# Patient Record
Sex: Male | Born: 1975
Health system: Southern US, Community
[De-identification: ages and names within clinical notes are randomized; demographics above are authoritative.]

## PROBLEM LIST (undated history)

## (undated) DIAGNOSIS — M79603 Pain in arm, unspecified: Secondary | ICD-10-CM

## (undated) DIAGNOSIS — M549 Dorsalgia, unspecified: Secondary | ICD-10-CM

## (undated) DIAGNOSIS — M542 Cervicalgia: Secondary | ICD-10-CM

## (undated) HISTORY — PX: KNEE SURGERY: SHX244

## (undated) HISTORY — PX: HERNIA REPAIR: SHX51

---

## 2006-03-10 ENCOUNTER — Emergency Department: Payer: Self-pay | Admitting: General Practice

## 2006-07-12 ENCOUNTER — Emergency Department: Payer: Self-pay | Admitting: Emergency Medicine

## 2006-12-04 ENCOUNTER — Emergency Department: Payer: Self-pay | Admitting: Emergency Medicine

## 2008-08-14 ENCOUNTER — Emergency Department: Payer: Self-pay

## 2011-02-11 ENCOUNTER — Other Ambulatory Visit: Payer: Self-pay | Admitting: Physician Assistant

## 2011-02-11 DIAGNOSIS — M5410 Radiculopathy, site unspecified: Secondary | ICD-10-CM

## 2011-02-18 ENCOUNTER — Other Ambulatory Visit: Payer: Self-pay | Admitting: Physician Assistant

## 2011-02-18 ENCOUNTER — Ambulatory Visit
Admission: RE | Admit: 2011-02-18 | Discharge: 2011-02-18 | Disposition: A | Discharge status: Home / Self Care | Payer: 59 | Source: Ambulatory Visit | Attending: Physician Assistant | Admitting: Physician Assistant

## 2011-02-18 DIAGNOSIS — M5410 Radiculopathy, site unspecified: Secondary | ICD-10-CM

## 2011-02-18 DIAGNOSIS — M5412 Radiculopathy, cervical region: Secondary | ICD-10-CM

## 2011-02-18 DIAGNOSIS — M4802 Spinal stenosis, cervical region: Secondary | ICD-10-CM

## 2011-03-11 ENCOUNTER — Other Ambulatory Visit: Payer: 59

## 2012-06-11 ENCOUNTER — Emergency Department (HOSPITAL_COMMUNITY): Payer: Self-pay

## 2012-06-11 ENCOUNTER — Encounter (HOSPITAL_COMMUNITY): Payer: Self-pay | Admitting: *Deleted

## 2012-06-11 ENCOUNTER — Emergency Department (HOSPITAL_COMMUNITY)
Admission: EM | Admit: 2012-06-11 | Discharge: 2012-06-11 | Disposition: A | Discharge status: Home / Self Care | Payer: Self-pay | Attending: Emergency Medicine | Admitting: Emergency Medicine

## 2012-06-11 DIAGNOSIS — W1789XA Other fall from one level to another, initial encounter: Secondary | ICD-10-CM | POA: Insufficient documentation

## 2012-06-11 DIAGNOSIS — Y998 Other external cause status: Secondary | ICD-10-CM | POA: Insufficient documentation

## 2012-06-11 DIAGNOSIS — Y9383 Activity, rough housing and horseplay: Secondary | ICD-10-CM | POA: Insufficient documentation

## 2012-06-11 DIAGNOSIS — S4350XA Sprain of unspecified acromioclavicular joint, initial encounter: Secondary | ICD-10-CM | POA: Insufficient documentation

## 2012-06-11 DIAGNOSIS — F17200 Nicotine dependence, unspecified, uncomplicated: Secondary | ICD-10-CM | POA: Insufficient documentation

## 2012-06-11 MED ORDER — NAPROXEN 500 MG PO TABS: 500.0000 mg | ORAL_TABLET | Freq: Two times a day (BID) | ORAL | Status: DC

## 2012-06-11 MED ORDER — OXYCODONE-ACETAMINOPHEN 5-325 MG PO TABS
1.0000 | ORAL_TABLET | Freq: Once | ORAL | Status: AC
  Administered 2012-06-11: 1 via ORAL
  Filled 2012-06-11: qty 1

## 2012-06-11 MED ORDER — OXYCODONE-ACETAMINOPHEN 5-325 MG PO TABS: 1.0000 | ORAL_TABLET | ORAL | Status: AC | PRN

## 2012-06-11 NOTE — Discharge Instructions (Signed)
Acromioclavicular Injuries  The AC (acromioclavicular) joint is the joint in the shoulder where the collarbone (clavicle) meets the shoulder blade (scapula). The part of the shoulder blade connected to the collarbone is called the acromion. Common problems with and treatments for the AC joint are detailed below.  ARTHRITIS  Arthritis occurs when the joint has been injured and the smooth padding between the joints (cartilage) is lost. This is the wear and tear seen in most joints of the body if they have been overused. This causes the joint to produce pain and swelling which is worse with activity.   AC JOINT SEPARATION  AC joint separation means that the ligaments connecting the acromion of the shoulder blade and collarbone have been damaged, and the two bones no longer line up. AC separations can be anywhere from mild to severe, and are "graded" depending upon which ligaments are torn and how badly they are torn.   Grade I Injury: the least damage is done, and the AC joint still lines up.   Grade II Injury: damage to the ligaments which reinforce the AC joint. In a Grade II injury, these ligaments are stretched but not entirely torn. When stressed, the AC joint becomes painful and unstable.   Grade III Injury: AC and secondary ligaments are completely torn, and the collarbone is no longer attached to the shoulder blade. This results in deformity; a prominence of the end of the clavicle.  AC JOINT FRACTURE  AC joint fracture means that there has been a break in the bones of the AC joint, usually the end of the clavicle.  TREATMENT  TREATMENT OF AC ARTHRITIS   There is currently no way to replace the cartilage damaged by arthritis. The best way to improve the condition is to decrease the activities which aggravate the problem. Application of ice to the joint helps decrease pain and soreness (inflammation). The use of non-steroidal anti-inflammatory medication is helpful.   If less conservative measures do not  work, then cortisone shots (injections) may be used. These are anti-inflammatories; they decrease the soreness in the joint and swelling.   If non-surgical measures fail, surgery may be recommended. The procedure is generally removal of a portion of the end of the clavicle. This is the part of the collarbone closest to your acromion which is stabilized with ligaments to the acromion of the shoulder blade. This surgery may be performed using a tube-like instrument with a light (arthroscope) for looking into a joint. It may also be performed as an open surgery through a small incision by the surgeon. Most patients will have good range of motion within 6 weeks and may return to all activity including sports by 8-12 weeks, barring complications.  TREATMENT OF AN AC SEPARATION   The initial treatment is to decrease pain. This is best accomplished by immobilizing the arm in a sling and placing an ice pack to the shoulder for 20 to 30 minutes every 2 hours as needed. As the pain starts to subside, it is important to begin moving the fingers, wrist, elbow and eventually the shoulder in order to prevent a stiff or "frozen" shoulder. Instruction on when and how much to move the shoulder will be provided by your caregiver. The length of time needed to regain full motion and function depends on the amount or grade of the injury. Recovery from a Grade I AC separation usually takes 10 to 14 days, whereas a Grade III may take 6 to 8 weeks.   Grade   III injuries usually allow return to full activity with few restrictions. Treatment is also based on the activity demands of the injured shoulder. For example, a high level quarterback with an injured throwing arm will receive more aggressive treatment than someone with a desk job who rarely uses his/her arm for strenuous activities. In some cases, a painful lump may persist which could require a later surgery.  Surgery can be very successful, but the benefits must be weighed against the potential risks.  TREATMENT OF AN AC JOINT FRACTURE Fracture treatment depends on the type of fracture. Sometimes a splint or sling may be all that is required. Other times surgery may be required for repair. This is more frequently the case when the ligaments supporting the clavicle are completely torn. Your caregiver will help you with these decisions and together you can decide what will be the best treatment. HOME CARE INSTRUCTIONS   Apply ice to the injury for 15 to 20 minutes each hour while awake for 2 days. Put the ice in a plastic bag and place a towel between the bag of ice and skin.   If a sling has been applied, wear it constantly for as long as directed by your caregiver, even at night. The sling or splint can be removed for bathing or showering or as directed. Be sure to keep the shoulder in the same place as when the sling is on. Do not lift the arm.   If a figure-of-eight splint has been applied it should be tightened gently by another person every day. Tighten it enough to keep the shoulders held back. Allow enough room to place the index finger between the body and strap. Loosen the splint immediately if there is numbness or tingling in the hands.   Take over-the-counter or prescription medicines for pain, discomfort or fever as directed by your caregiver.   If you or your child has received a follow up appointment, it is very important to keep that appointment in order to avoid long term complications, chronic pain or disability.  SEEK MEDICAL CARE IF:   The pain is not relieved with medications.   There is increased swelling or discoloration that continues to get worse rather than better.   You or your child has been unable to follow up as instructed.   There is progressive numbness and tingling in the arm, forearm or hand.  SEEK IMMEDIATE MEDICAL CARE IF:   The arm is numb, cold or pale.    There is increasing pain in the hand, forearm or fingers.  MAKE SURE YOU:   Understand these instructions.   Will watch your condition.   Will get help right away if you are not doing well or get worse.  Document Released: 09/21/2005 Document Revised: 12/01/2011 Document Reviewed: 03/16/2009 [facility] Patient Information 2012 [place], [place].  Smoking Hazards Smoking cigarettes is extremely bad for your health. Tobacco smoke has over 200 known poisons in it. There are over 60 chemicals in tobacco smoke that cause cancer. Some of the chemicals found in cigarette smoke include:   Cyanide.   Benzene.   Formaldehyde.   Methanol (wood alcohol).   Acetylene (fuel used in welding torches).   Ammonia.  Cigarette smoke also contains the poisonous gases nitrogen oxide and carbon monoxide.  Cigarette smokers have an increased risk of many serious medical problems, including:  Lung cancer.   Lung disease (such as pneumonia, bronchitis, and emphysema).   Heart attack and chest pain due to the heart not  getting enough oxygen (angina).   Heart disease and peripheral blood vessel disease.   Hypertension.   Stroke.   Oral cancer (cancer of the lip, mouth, or voice box).   Bladder cancer.   Pancreatic cancer.   Cervical cancer.   Pregnancy complications, including premature birth.   Low birthweight babies.   Early menopause.   Lower estrogen level for women.   Infertility.   Facial wrinkles.   Blindness.   Increased risk of broken bones (fractures).   Senile dementia.   Stillbirths and smaller newborn babies, birth defects, and genetic damage to sperm.   Stomach ulcers and internal bleeding.  Children of smokers have an increased risk of the following, because of secondhand smoke exposure:   Sudden infant death syndrome (SIDS).   Respiratory infections.   Lung cancer.   Heart disease.   Ear infections.  Smoking causes approximately:  90% of all lung  cancer deaths in men.   80% of all lung cancer deaths in women.   90% of deaths from chronic obstructive lung disease.  Compared with nonsmokers, smoking increases the risk of:  Coronary heart disease by 2 to 4 times.   Stroke by 2 to 4 times.   Men developing lung cancer by 23 times.   Women developing lung cancer by 13 times.   Dying from chronic obstructive lung diseases by 12 times.  Someone who smokes 2 packs a day loses about 8 years of his or her life. Even smoking lightly shortens your life expectancy by several years. You can greatly reduce the risk of medical problems for you and your family by stopping now. Smoking is the most preventable cause of death and disease in our society. Within days of quitting smoking, your circulation returns to normal, you decrease the risk of having a heart attack, and your lung capacity improves. There may be some increased phlegm in the first few days after quitting, and it may take months for your lungs to clear up completely. Quitting for 10 years cuts your lung cancer risk to almost that of a nonsmoker. WHY IS SMOKING ADDICTIVE?  Nicotine is the chemical agent in tobacco that is capable of causing addiction or dependence.   When you smoke and inhale, nicotine is absorbed rapidly into the bloodstream through your lungs. Nicotine absorbed through the lungs is capable of creating a powerful addiction. Both inhaled and non-inhaled nicotine may be addictive.   Addiction studies of cigarettes and spit tobacco show that addiction to nicotine occurs mainly during the teen years, when young people begin using tobacco products.  WHAT ARE THE BENEFITS OF QUITTING?  There are many health benefits to quitting smoking.   Likelihood of developing cancer and heart disease decreases. Health improvements are seen almost immediately.   Blood pressure, pulse rate, and breathing patterns start returning to normal soon after quitting.   People who quit may see  an improvement in their overall quality of life.  Some people choose to quit all at once. Other options include nicotine replacement products, such as patches, gum, and nasal sprays. Do not use these products without first checking with your caregiver. QUITTING SMOKING It is not easy to quit smoking. Nicotine is addicting, and longtime habits are hard to change. To start, you can write down all your reasons for quitting, tell your family and friends you want to quit, and ask for their help. Throw your cigarettes away, chew gum or cinnamon sticks, keep your hands busy, and drink extra water  or juice. Go for walks and practice deep breathing to relax. Think of all the money you are saving: around $1,000 a year, for the average pack-a-day smoker. Nicotine patches and gum have been shown to improve success at efforts to stop smoking. Zyban (bupropion) is an anti-depressant drug that can be prescribed to reduce nicotine withdrawal symptoms and to suppress the urge to smoke. Smoking is an addiction with both physical and psychological effects. Joining a stop-smoking support group can help you cope with the emotional issues. For more information and advice on programs to stop smoking, call your doctor, your local hospital, or these organizations:  American Lung Association - 1-800-LUNGUSA   American Cancer Society - 1-800-ACS-2345  Document Released: 01/19/2005 Document Revised: 12/01/2011 Document Reviewed: 09/23/2009 [facility] Patient Information 2012 [place], [place].  Naproxen and naproxen sodium oral immediate-release tablets What is this medicine? NAPROXEN (na PROX en) is a non-steroidal anti-inflammatory drug (NSAID). It is used to reduce swelling and to treat pain. This medicine may be used for dental pain, headache, or painful monthly periods. It is also used for painful joint and muscular problems such as arthritis, tendinitis, bursitis, and gout. This medicine may be used for other purposes; ask  your health care provider or pharmacist if you have questions. What should I tell my health care provider before I take this medicine? They need to know if you have any of these conditions: -asthma -cigarette smoker -drink more than 3 alcohol containing drinks a day -heart disease or circulation problems such as heart failure or leg edema (fluid retention) -high blood pressure -kidney disease -liver disease -stomach bleeding or ulcers -an unusual or allergic reaction to naproxen, aspirin, other NSAIDs, other medicines, foods, dyes, or preservatives -pregnant or trying to get pregnant -breast-feeding How should I use this medicine? Take this medicine by mouth with a glass of water. Follow the directions on the prescription label. Take it with food if your stomach gets upset. Try to not lie down for at least 10 minutes after you take it. Take your medicine at regular intervals. Do not take your medicine more often than directed. Long-term, continuous use may increase the risk of heart attack or stroke. A special MedGuide will be given to you by the pharmacist with each prescription and refill. Be sure to read this information carefully each time. Talk to your pediatrician regarding the use of this medicine in children. Special care may be needed. Overdosage: If you think you have taken too much of this medicine contact a poison control center or emergency room at once. NOTE: This medicine is only for you. Do not share this medicine with others. What if I miss a dose? If you miss a dose, take it as soon as you can. If it is almost time for your next dose, take only that dose. Do not take double or extra doses. What may interact with this medicine? -alcohol -aspirin -cidofovir -diuretics -lithium -methotrexate -other drugs for inflammation like ketorolac or prednisone -pemetrexed -probenecid -warfarin This list may not describe all possible interactions. Give your health care provider a  list of all the medicines, herbs, non-prescription drugs, or dietary supplements you use. Also tell them if you smoke, drink alcohol, or use illegal drugs. Some items may interact with your medicine. What should I watch for while using this medicine? Tell your doctor or health care professional if your pain does not get better. Talk to your doctor before taking another medicine for pain. Do not treat yourself. This medicine  does not prevent heart attack or stroke. In fact, this medicine may increase the chance of a heart attack or stroke. The chance may increase with longer use of this medicine and in people who have heart disease. If you take aspirin to prevent heart attack or stroke, talk with your doctor or health care professional. Do not take other medicines that contain aspirin, ibuprofen, or naproxen with this medicine. Side effects such as stomach upset, nausea, or ulcers may be more likely to occur. Many medicines available without a prescription should not be taken with this medicine. This medicine can cause ulcers and bleeding in the stomach and intestines at any time during treatment. Do not smoke cigarettes or drink alcohol. These increase irritation to your stomach and can make it more susceptible to damage from this medicine. Ulcers and bleeding can happen without warning symptoms and can cause death. You may get drowsy or dizzy. Do not drive, use machinery, or do anything that needs mental alertness until you know how this medicine affects you. Do not stand or sit up quickly, especially if you are an older patient. This reduces the risk of dizzy or fainting spells. This medicine can cause you to bleed more easily. Try to avoid damage to your teeth and gums when you brush or floss your teeth. What side effects may I notice from receiving this medicine? Side effects that you should report to your doctor or health care professional as soon as possible: -black or bloody stools, blood in the  urine or vomit -blurred vision -chest pain -difficulty breathing or wheezing -nausea or vomiting -severe stomach pain -skin rash, skin redness, blistering or peeling skin, hives, or itching -slurred speech or weakness on one side of the body -swelling of eyelids, throat, lips -unexplained weight gain or swelling -unusually weak or tired -yellowing of eyes or skin Side effects that usually do not require medical attention (report to your doctor or health care professional if they continue or are bothersome): -constipation -headache -heartburn This list may not describe all possible side effects. Call your doctor for medical advice about side effects. You may report side effects to FDA at 1-800-FDA-1088. Where should I keep my medicine? Keep out of the reach of children. Store at room temperature between 15 and 30 degrees C (59 and 86 degrees F). Keep container tightly closed. Throw away any unused medicine after the expiration date. NOTE: This sheet is a summary. It may not cover all possible information. If you have questions about this medicine, talk to your doctor, pharmacist, or health care provider.  2012, [employer]vier/[employer] Standard. (12/14/2009 8:10:16 PM)  Acetaminophen; Oxycodone tablets What is this medicine? ACETAMINOPHEN; OXYCODONE (a set a MEE noe fen; ox i KOE done) is a pain reliever. It is used to treat mild to moderate pain. This medicine may be used for other purposes; ask your health care provider or pharmacist if you have questions. What should I tell my health care provider before I take this medicine? They need to know if you have any of these conditions: -brain tumor -Crohn's disease, inflammatory bowel disease, or ulcerative colitis -drink more than 3 alcohol containing drinks per day -drug abuse or addiction -head injury -heart or circulation problems -kidney disease or problems going to the bathroom -liver disease -lung disease, asthma, or breathing  problems -an unusual or allergic reaction to acetaminophen, oxycodone, other opioid analgesics, other medicines, foods, dyes, or preservatives -pregnant or trying to get pregnant -breast-feeding How should I use this medicine? Take  this medicine by mouth with a full glass of water. Follow the directions on the prescription label. Take your medicine at regular intervals. Do not take your medicine more often than directed. Talk to your pediatrician regarding the use of this medicine in children. Special care may be needed. Patients over 28 years old may have a stronger reaction and need a smaller dose. Overdosage: If you think you have taken too much of this medicine contact a poison control center or emergency room at once. NOTE: This medicine is only for you. Do not share this medicine with others. What if I miss a dose? If you miss a dose, take it as soon as you can. If it is almost time for your next dose, take only that dose. Do not take double or extra doses. What may interact with this medicine? -alcohol or medicines that contain alcohol -antihistamines -barbiturates like amobarbital, butalbital, butabarbital, methohexital, pentobarbital, phenobarbital, thiopental, and secobarbital -benztropine -drugs for bladder problems like solifenacin, trospium, oxybutynin, tolterodine, hyoscyamine, and methscopolamine -drugs for breathing problems like ipratropium and tiotropium -drugs for certain stomach or intestine problems like propantheline, homatropine methylbromide, glycopyrrolate, atropine, belladonna, and dicyclomine -general anesthetics like etomidate, ketamine, nitrous oxide, propofol, desflurane, enflurane, halothane, isoflurane, and sevoflurane -medicines for depression, anxiety, or psychotic disturbances -medicines for pain like codeine, morphine, pentazocine, buprenorphine, butorphanol, nalbuphine, tramadol, and propoxyphene -medicines for sleep -muscle  relaxants -naltrexone -phenothiazines like perphenazine, thioridazine, chlorpromazine, mesoridazine, fluphenazine, prochlorperazine, promazine, and trifluoperazine -scopolamine -trihexyphenidyl This list may not describe all possible interactions. Give your health care provider a list of all the medicines, herbs, non-prescription drugs, or dietary supplements you use. Also tell them if you smoke, drink alcohol, or use illegal drugs. Some items may interact with your medicine. What should I watch for while using this medicine? Tell your doctor or health care professional if your pain does not go away, if it gets worse, or if you have new or a different type of pain. You may develop tolerance to the medicine. Tolerance means that you will need a higher dose of the medication for pain relief. Tolerance is normal and is expected if you take this medicine for a long time. Do not suddenly stop taking your medicine because you may develop a severe reaction. Your body becomes used to the medicine. This does NOT mean you are addicted. Addiction is a behavior related to getting and using a drug for a nonmedical reason. If you have pain, you have a medical reason to take pain medicine. Your doctor will tell you how much medicine to take. If your doctor wants you to stop the medicine, the dose will be slowly lowered over time to avoid any side effects. You may get drowsy or dizzy. Do not drive, use machinery, or do anything that needs mental alertness until you know how this medicine affects you. Do not stand or sit up quickly, especially if you are an older patient. This reduces the risk of dizzy or fainting spells. Alcohol may interfere with the effect of this medicine. Avoid alcoholic drinks. The medicine will cause constipation. Try to have a bowel movement at least every 2 to 3 days. If you do not have a bowel movement for 3 days, call your doctor or health care professional. Do not take Tylenol (acetaminophen)  or medicines that have acetaminophen with this medicine. Too much acetaminophen can be very dangerous. Many nonprescription medicines contain acetaminophen. Always read the labels carefully to avoid taking more acetaminophen. What side effects may I  notice from receiving this medicine? Side effects that you should report to your doctor or health care professional as soon as possible: -allergic reactions like skin rash, itching or hives, swelling of the face, lips, or tongue -breathing difficulties, wheezing -confusion -light headedness or fainting spells -severe stomach pain -yellowing of the skin or the whites of the eyes Side effects that usually do not require medical attention (report to your doctor or health care professional if they continue or are bothersome): -dizziness -drowsiness -nausea -vomiting This list may not describe all possible side effects. Call your doctor for medical advice about side effects. You may report side effects to FDA at 1-800-FDA-1088. Where should I keep my medicine? Keep out of the reach of children. This medicine can be abused. Keep your medicine in a safe place to protect it from theft. Do not share this medicine with anyone. Selling or giving away this medicine is dangerous and against the law. Store at room temperature between 20 and 25 degrees C (68 and 77 degrees F). Keep container tightly closed. Protect from light. Flush any unused medicines down the toilet. Do not use the medicine after the expiration date. NOTE: This sheet is a summary. It may not cover all possible information. If you have questions about this medicine, talk to your doctor, pharmacist, or health care provider.  2012, [employer]vier/[employer] Standard. (11/10/2008 10:01:21 AM)

## 2012-06-11 NOTE — ED Notes (Signed)
Patient transported to X-ray 

## 2012-06-11 NOTE — ED Notes (Signed)
Pt fell on left shoulder yesterday and here with pain.  Pt is wiggling fingers.

## 2012-06-11 NOTE — Progress Notes (Signed)
Orthopedic Tech Progress Note Patient Details:  Ray Gonzalez 1976-01-29 [id number]  Ortho Devices Type of Ortho Device: Arm foam sling Ortho Device/Splint Location: left arm Ortho Device/Splint Interventions: Application   [name] 06/11/2012, 3:49 PM
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
## 2012-07-07 ENCOUNTER — Emergency Department (HOSPITAL_COMMUNITY)
Admission: EM | Admit: 2012-07-07 | Discharge: 2012-07-07 | Disposition: A | Discharge status: Home / Self Care | Payer: Managed Care, Other (non HMO) | Attending: Emergency Medicine | Admitting: Emergency Medicine

## 2012-07-07 ENCOUNTER — Emergency Department (HOSPITAL_COMMUNITY): Payer: Managed Care, Other (non HMO)

## 2012-07-07 ENCOUNTER — Encounter (HOSPITAL_COMMUNITY): Payer: Self-pay | Admitting: Emergency Medicine

## 2012-07-07 DIAGNOSIS — S339XXA Sprain of unspecified parts of lumbar spine and pelvis, initial encounter: Secondary | ICD-10-CM | POA: Insufficient documentation

## 2012-07-07 DIAGNOSIS — S139XXA Sprain of joints and ligaments of unspecified parts of neck, initial encounter: Secondary | ICD-10-CM | POA: Insufficient documentation

## 2012-07-07 DIAGNOSIS — S39012A Strain of muscle, fascia and tendon of lower back, initial encounter: Secondary | ICD-10-CM

## 2012-07-07 DIAGNOSIS — S161XXA Strain of muscle, fascia and tendon at neck level, initial encounter: Secondary | ICD-10-CM

## 2012-07-07 DIAGNOSIS — M542 Cervicalgia: Secondary | ICD-10-CM | POA: Insufficient documentation

## 2012-07-07 DIAGNOSIS — M545 Low back pain, unspecified: Secondary | ICD-10-CM | POA: Insufficient documentation

## 2012-07-07 DIAGNOSIS — M79609 Pain in unspecified limb: Secondary | ICD-10-CM | POA: Insufficient documentation

## 2012-07-07 HISTORY — DX: Cervicalgia: M54.2

## 2012-07-07 HISTORY — DX: Dorsalgia, unspecified: M54.9

## 2012-07-07 HISTORY — DX: Pain in arm, unspecified: M79.603

## 2012-07-07 MED ORDER — IBUPROFEN 600 MG PO TABS: 600.0000 mg | ORAL_TABLET | Freq: Four times a day (QID) | ORAL | Status: AC | PRN

## 2012-07-07 MED ORDER — OXYCODONE-ACETAMINOPHEN 5-325 MG PO TABS
1.0000 | ORAL_TABLET | Freq: Once | ORAL | Status: AC
  Administered 2012-07-07: 1 via ORAL
  Filled 2012-07-07: qty 1

## 2012-07-07 MED ORDER — OXYCODONE-ACETAMINOPHEN 5-325 MG PO TABS: 1.0000 | ORAL_TABLET | Freq: Four times a day (QID) | ORAL | Status: AC | PRN

## 2012-07-07 MED ORDER — IBUPROFEN 400 MG PO TABS
600.0000 mg | ORAL_TABLET | Freq: Once | ORAL | Status: AC
  Administered 2012-07-07: 600 mg via ORAL
  Filled 2012-07-07: qty 3
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
## 2013-07-20 ENCOUNTER — Emergency Department: Payer: Self-pay | Admitting: Emergency Medicine

## 2013-07-20 LAB — CBC
HGB: 14.3 g/dL (ref 13.0–18.0)
MCV: 83 fL (ref 80–100)
RDW: 13.3 % (ref 11.5–14.5)

## 2013-07-20 LAB — URINALYSIS, COMPLETE
Glucose,UR: NEGATIVE mg/dL (ref 0–75)
RBC,UR: <1 /HPF (ref 0–5)
Specific Gravity: 1.016 (ref 1.003–1.030)

## 2013-07-20 LAB — COMPREHENSIVE METABOLIC PANEL
Alkaline Phosphatase: 81 U/L (ref 50–136)
BUN: 13 mg/dL (ref 7–18)
Bilirubin,Total: 0.4 mg/dL (ref 0.2–1.0)
Calcium, Total: 9.3 mg/dL (ref 8.5–10.1)
Co2: 34 mmol/L — ABNORMAL HIGH (ref 21–32)
Creatinine: 0.91 mg/dL (ref 0.60–1.30)
EGFR (African American): >60
Glucose: 112 mg/dL — ABNORMAL HIGH (ref 65–99)
Osmolality: 278 (ref 275–301)
Potassium: 4.3 mmol/L (ref 3.5–5.1)
SGPT (ALT): 23 U/L (ref 12–78)
Sodium: 139 mmol/L (ref 136–145)

## 2013-07-20 LAB — DRUG SCREEN, URINE
Benzodiazepine, Ur Scrn: NEGATIVE (ref ?–200)
Cocaine Metabolite,Ur ~~LOC~~: NEGATIVE (ref ?–300)
MDMA (Ecstasy)Ur Screen: NEGATIVE (ref ?–500)
Tricyclic, Ur Screen: NEGATIVE (ref ?–1000)

## 2015-09-25 ENCOUNTER — Other Ambulatory Visit (HOSPITAL_COMMUNITY): Payer: Self-pay | Admitting: Internal Medicine

## 2015-09-25 ENCOUNTER — Ambulatory Visit (HOSPITAL_COMMUNITY)
Admission: RE | Admit: 2015-09-25 | Discharge: 2015-09-25 | Disposition: A | Discharge status: Home / Self Care | Payer: 59 | Source: Ambulatory Visit | Attending: Internal Medicine | Admitting: Internal Medicine

## 2015-09-25 DIAGNOSIS — R0602 Shortness of breath: Secondary | ICD-10-CM

## 2016-01-28 ENCOUNTER — Emergency Department
Admission: EM | Admit: 2016-01-28 | Discharge: 2016-01-28 | Disposition: A | Discharge status: Home / Self Care | Payer: 59 | Attending: Emergency Medicine | Admitting: Emergency Medicine

## 2016-01-28 ENCOUNTER — Encounter: Payer: Self-pay | Admitting: Emergency Medicine

## 2016-01-28 DIAGNOSIS — F17210 Nicotine dependence, cigarettes, uncomplicated: Secondary | ICD-10-CM | POA: Diagnosis not present

## 2016-01-28 DIAGNOSIS — Z202 Contact with and (suspected) exposure to infections with a predominantly sexual mode of transmission: Secondary | ICD-10-CM | POA: Insufficient documentation

## 2016-01-28 DIAGNOSIS — Z880 Allergy status to penicillin: Secondary | ICD-10-CM | POA: Insufficient documentation

## 2016-01-28 NOTE — ED Notes (Signed)
States he has been exposed to STD

## 2016-01-28 NOTE — Discharge Instructions (Signed)
Follow-up with [facility] Department for definitive evaluation and treatment.

## 2016-01-28 NOTE — ED Notes (Signed)
Assessed per PA 
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
## 2017-06-22 ENCOUNTER — Other Ambulatory Visit (HOSPITAL_COMMUNITY): Payer: Self-pay | Admitting: Internal Medicine

## 2017-06-22 ENCOUNTER — Ambulatory Visit (HOSPITAL_COMMUNITY)
Admission: RE | Admit: 2017-06-22 | Discharge: 2017-06-22 | Disposition: A | Discharge status: Home / Self Care | Payer: BLUE CROSS/BLUE SHIELD | Source: Ambulatory Visit | Attending: Internal Medicine | Admitting: Internal Medicine

## 2017-06-22 DIAGNOSIS — R0602 Shortness of breath: Secondary | ICD-10-CM

## 2017-07-25 ENCOUNTER — Encounter: Payer: Self-pay | Admitting: Emergency Medicine

## 2017-07-25 ENCOUNTER — Emergency Department
Admission: EM | Admit: 2017-07-25 | Discharge: 2017-07-25 | Disposition: A | Discharge status: Home / Self Care | Payer: BLUE CROSS/BLUE SHIELD | Attending: Emergency Medicine | Admitting: Emergency Medicine

## 2017-07-25 ENCOUNTER — Emergency Department
Admission: EM | Admit: 2017-07-25 | Discharge: 2017-07-25 | Discharge status: Against Medical Advice | Payer: BLUE CROSS/BLUE SHIELD | Source: Home / Self Care

## 2017-07-25 DIAGNOSIS — H5712 Ocular pain, left eye: Secondary | ICD-10-CM | POA: Diagnosis present

## 2017-07-25 DIAGNOSIS — W458XXA Other foreign body or object entering through skin, initial encounter: Secondary | ICD-10-CM | POA: Diagnosis not present

## 2017-07-25 DIAGNOSIS — T1592XA Foreign body on external eye, part unspecified, left eye, initial encounter: Secondary | ICD-10-CM

## 2017-07-25 DIAGNOSIS — H16002 Unspecified corneal ulcer, left eye: Secondary | ICD-10-CM | POA: Insufficient documentation

## 2017-07-25 DIAGNOSIS — Y9389 Activity, other specified: Secondary | ICD-10-CM | POA: Diagnosis not present

## 2017-07-25 DIAGNOSIS — F17210 Nicotine dependence, cigarettes, uncomplicated: Secondary | ICD-10-CM | POA: Insufficient documentation

## 2017-07-25 DIAGNOSIS — T1502XA Foreign body in cornea, left eye, initial encounter: Secondary | ICD-10-CM | POA: Diagnosis not present

## 2017-07-25 DIAGNOSIS — Y9289 Other specified places as the place of occurrence of the external cause: Secondary | ICD-10-CM | POA: Diagnosis not present

## 2017-07-25 DIAGNOSIS — Y998 Other external cause status: Secondary | ICD-10-CM | POA: Insufficient documentation

## 2017-07-25 DIAGNOSIS — H579 Unspecified disorder of eye and adnexa: Secondary | ICD-10-CM | POA: Insufficient documentation

## 2017-07-25 MED ORDER — FLUORESCEIN SODIUM 0.6 MG OP STRP
1.0000 | ORAL_STRIP | Freq: Once | OPHTHALMIC | Status: AC
  Administered 2017-07-25: 1 via OPHTHALMIC
  Filled 2017-07-25: qty 1

## 2017-07-25 NOTE — ED Triage Notes (Signed)
Presents with pain and redness to left eye   States he thinks he got some metal in eye 2 days ago

## 2017-07-25 NOTE — Discharge Instructions (Signed)
You have a retained metal foreign body to the left eye. You will need to be seen by Dr. Brooke DareKing to have it removed and treated. Go to his office, directly from the ED for further treatment.

## 2017-07-25 NOTE — ED Provider Notes (Signed)
[facility] Emergency Department Provider Note ____________________________________________  Time seen: [id number]  I have reviewed the triage vital signs and the nursing notes.  HISTORY  Chief Complaint  Eye Pain  HPI Tia AlertChristopher W Gonzalez is a 41 y.o. male presents to the ED for evaluation of left eye foreign body sensation. Patient has a small metal foreign body retained in the left eye from 2 days prior. He was working doing grinding work, with appropriate eye protection on, but admits to receiving a foreign body to the left eye. He is attempted to flush the eye but has been unsuccessful. He denies any nausea, vomiting, or dizziness. Denies any significant visual disturbance at this time.  Past Medical History:  Diagnosis Date  . Arm pain   . Back pain   . Neck pain     There are no active problems to display for this patient.   Past Surgical History:  Procedure Laterality Date  . HERNIA REPAIR    . KNEE SURGERY     left    Prior to Admission medications   Medication Sig Start Date End Date Taking? Authorizing Provider  ibuprofen (ADVIL,MOTRIN) 200 MG tablet Take 200 mg by mouth every 6 (six) hours as needed. For pain    [provider]    Allergies Mobic [meloxicam] and Penicillins  No family history on file.  Social History Social History  Substance Use Topics  . Smoking status: Current Every Day Smoker    Types: Cigarettes  . Smokeless tobacco: Never Used  . Alcohol use No    Review of Systems  Constitutional: Negative for fever. Eyes: Negative for visual changes. Left eye foreign body as above. ENT: Negative for sore throat. Cardiovascular: Negative for chest pain. Respiratory: Negative for shortness of breath. Gastrointestinal: Negative for abdominal pain, vomiting and diarrhea. Neurological: Negative for headaches, focal weakness or numbness. ____________________________________________  PHYSICAL EXAM:  VITAL SIGNS: ED  Triage Vitals  Enc Vitals Group     BP 07/25/17 1730 125/81     Pulse Rate 07/25/17 1730 88     Resp 07/25/17 1730 18     Temp 07/25/17 1730 98.4 F (36.9 C)     Temp Source 07/25/17 1730 Oral     SpO2 07/25/17 1730 99 %     Weight --      Height --      Head Circumference --      Peak Flow --      Pain Score 07/25/17 1737 3     Pain Loc --      Pain Edu? --      Excl. in GC? --     Constitutional: Alert and oriented. Well appearing and in no distress. Head: Normocephalic and atraumatic. Eyes: Conjunctivae are injected on the left. Gross inspection of the left eye reveals a metallic foreign body at 9 o'clock over the cornea. Upper lid eversion does not reveal a retained foreign body. The foreign body was only partially removed using a definite, sterile cotton-tipped swab. There is a rust ring noted. There is fluorescein dye uptake at the area of the retained FB. PERRL and normal funduscopic exam. Normal extraocular movements Respiratory: Normal respiratory effort.  Musculoskeletal: Nontender with normal range of motion in all extremities.  Neurologic:  Normal gait without ataxia. Normal speech and language. No gross focal neurologic deficits are appreciated. Skin:  Skin is warm, dry and intact. No rash noted. ____________________________________________  PROCEDURES  Tetracaine iii gtts OS ____________________________________________  INITIAL  IMPRESSION / ASSESSMENT AND PLAN / ED COURSE  ----------------------------------------- 6:06 PM on 07/25/2017 ----------------------------------------- S/W Dr. Brooke DareKing, he will see the patient at the office, directly, for FB removal.   Patient with initial evaluation of a retained left eye foreign body. Patient is discharged with instructions to [facility], directly from discharge from the ED for foreign body removal from the left eye. He verbalizes understanding. Further management will be directed by Dr.  Brooke DareKing. ____________________________________________  FINAL CLINICAL IMPRESSION(S) / ED DIAGNOSES  Final diagnoses:  Foreign body of left eye, initial encounter  Ulcer of left cornea      Lissa HoardMenshew, Makinzi Prieur V Bacon, PA-C 07/25/17 1827    Phineas SemenGoodman, Graydon, MD 07/25/17 2142

## 2017-07-25 NOTE — ED Triage Notes (Signed)
Called pt x3  In waiting room. No answer.

## 2017-07-25 NOTE — ED Triage Notes (Signed)
?   Metal to left eye-- 2 days ago.  States was using a grinder when metal piece went into left eye.  Left eye sclera reddend.  Good ocular movement seen.  Visual fields intact.

## 2018-01-19 ENCOUNTER — Other Ambulatory Visit: Payer: Self-pay | Admitting: Sports Medicine

## 2018-01-19 DIAGNOSIS — M4722 Other spondylosis with radiculopathy, cervical region: Secondary | ICD-10-CM

## 2018-01-30 ENCOUNTER — Other Ambulatory Visit: Payer: Self-pay | Admitting: Sports Medicine

## 2018-01-30 DIAGNOSIS — M4722 Other spondylosis with radiculopathy, cervical region: Secondary | ICD-10-CM

## 2018-02-12 ENCOUNTER — Other Ambulatory Visit: Payer: Self-pay | Admitting: Sports Medicine

## 2018-02-12 DIAGNOSIS — M4722 Other spondylosis with radiculopathy, cervical region: Secondary | ICD-10-CM

## 2018-02-23 ENCOUNTER — Other Ambulatory Visit: Payer: BLUE CROSS/BLUE SHIELD

## 2018-02-23 ENCOUNTER — Inpatient Hospital Stay: Admission: RE | Admit: 2018-02-23 | Payer: BLUE CROSS/BLUE SHIELD | Source: Ambulatory Visit

## 2018-03-02 ENCOUNTER — Ambulatory Visit
Admission: RE | Admit: 2018-03-02 | Discharge: 2018-03-02 | Disposition: A | Discharge status: Home / Self Care | Payer: BLUE CROSS/BLUE SHIELD | Source: Ambulatory Visit | Attending: Sports Medicine | Admitting: Sports Medicine

## 2018-03-02 DIAGNOSIS — M4722 Other spondylosis with radiculopathy, cervical region: Secondary | ICD-10-CM

## 2018-04-02 ENCOUNTER — Encounter: Payer: Self-pay | Admitting: Emergency Medicine

## 2018-04-02 ENCOUNTER — Emergency Department
Admission: EM | Admit: 2018-04-02 | Discharge: 2018-04-02 | Disposition: A | Discharge status: Home / Self Care | Payer: BLUE CROSS/BLUE SHIELD | Attending: Emergency Medicine | Admitting: Emergency Medicine

## 2018-04-02 DIAGNOSIS — F17210 Nicotine dependence, cigarettes, uncomplicated: Secondary | ICD-10-CM | POA: Diagnosis not present

## 2018-04-02 DIAGNOSIS — Y939 Activity, unspecified: Secondary | ICD-10-CM | POA: Diagnosis not present

## 2018-04-02 DIAGNOSIS — S61215A Laceration without foreign body of left ring finger without damage to nail, initial encounter: Secondary | ICD-10-CM | POA: Diagnosis present

## 2018-04-02 DIAGNOSIS — Y999 Unspecified external cause status: Secondary | ICD-10-CM | POA: Insufficient documentation

## 2018-04-02 DIAGNOSIS — Y929 Unspecified place or not applicable: Secondary | ICD-10-CM | POA: Insufficient documentation

## 2018-04-02 DIAGNOSIS — Z23 Encounter for immunization: Secondary | ICD-10-CM | POA: Insufficient documentation

## 2018-04-02 DIAGNOSIS — Z79899 Other long term (current) drug therapy: Secondary | ICD-10-CM | POA: Insufficient documentation

## 2018-04-02 DIAGNOSIS — W260XXA Contact with knife, initial encounter: Secondary | ICD-10-CM | POA: Diagnosis not present

## 2018-04-02 MED ORDER — TETANUS-DIPHTH-ACELL PERTUSSIS 5-2.5-18.5 LF-MCG/0.5 IM SUSP
0.5000 mL | Freq: Once | INTRAMUSCULAR | Status: AC
  Administered 2018-04-02: 0.5 mL via INTRAMUSCULAR
  Filled 2018-04-02: qty 0.5

## 2018-04-02 MED ORDER — TRAMADOL HCL 50 MG PO TABS
50.0000 mg | ORAL_TABLET | Freq: Once | ORAL | Status: DC
  Filled 2018-04-02: qty 1

## 2018-04-02 MED ORDER — SULFAMETHOXAZOLE-TRIMETHOPRIM 800-160 MG PO TABS: 1.0000 | ORAL_TABLET | Freq: Two times a day (BID) | ORAL | 0 refills | Status: DC

## 2018-04-02 MED ORDER — SULFAMETHOXAZOLE-TRIMETHOPRIM 800-160 MG PO TABS
1.0000 | ORAL_TABLET | Freq: Once | ORAL | Status: AC
  Administered 2018-04-02: 1 via ORAL
  Filled 2018-04-02: qty 1

## 2018-04-02 MED ORDER — TRAMADOL HCL 50 MG PO TABS: 50.0000 mg | ORAL_TABLET | Freq: Two times a day (BID) | ORAL | 0 refills | Status: DC | PRN

## 2018-04-02 NOTE — ED Provider Notes (Signed)
[facility] Emergency Department Provider Note   ____________________________________________   First MD Initiated Contact with Patient 04/02/18 1425     (approximate)  I have reviewed the triage vital signs and the nursing notes.   HISTORY  Chief Complaint Laceration    HPI Tia AlertChristopher W Ardila is a 42 y.o. male patient presents with laceration to distal fourth digit left hand which occurred 2 days ago.  Patient finger cut with a knife.  Patient unsure last tetanus shot.  Patient rates pain as a 1/10.  Patient described the pain is "sore".  Patient denies loss of function or sensation to the affected digit.  Palliative measures for complaint.   Past Medical History:  Diagnosis Date  . Arm pain   . Back pain   . Neck pain     There are no active problems to display for this patient.   Past Surgical History:  Procedure Laterality Date  . HERNIA REPAIR    . KNEE SURGERY     left    Prior to Admission medications   Medication Sig Start Date End Date Taking? Authorizing Provider  ibuprofen (ADVIL,MOTRIN) 200 MG tablet Take 200 mg by mouth every 6 (six) hours as needed. For pain    [provider]  sulfamethoxazole-trimethoprim (BACTRIM DS,SEPTRA DS) 800-160 MG tablet Take 1 tablet by mouth 2 (two) times daily. 04/02/18   Joni ReiningSmith, Jubilee Vivero K, PA-C  traMADol (ULTRAM) 50 MG tablet Take 1 tablet (50 mg total) by mouth every 12 (twelve) hours as needed. 04/02/18   Joni ReiningSmith, Aneita Kiger K, PA-C    Allergies Mobic [meloxicam] and Penicillins  History reviewed. No pertinent family history.  Social History Social History   Tobacco Use  . Smoking status: Current Every Day Smoker    Types: Cigarettes  . Smokeless tobacco: Never Used  Substance Use Topics  . Alcohol use: No  . Drug use: No    Review of Systems Constitutional: No fever/chills Eyes: No visual changes. ENT: No sore throat. Cardiovascular: Denies chest pain. Respiratory: Denies  shortness of breath. Gastrointestinal: No abdominal pain.  No nausea, no vomiting.  No diarrhea.  No constipation. Genitourinary: Negative for dysuria. Musculoskeletal: Negative for back pain. Skin: Finger laceration left hand Neurological: Negative for headaches, focal weakness or numbness. Hematological/Lymphatic: Allergic/Immunilogical: Penicillin and meloxicam  ____________________________________________   PHYSICAL EXAM:  VITAL SIGNS: ED Triage Vitals  Enc Vitals Group     BP 04/02/18 1310 128/85     Pulse Rate 04/02/18 1310 77     Resp 04/02/18 1310 18     Temp 04/02/18 1310 98.3 F (36.8 C)     Temp Source 04/02/18 1310 Oral     SpO2 04/02/18 1310 100 %     Weight 04/02/18 1305 188 lb (85.3 kg)     Height 04/02/18 1305 5\' 9"  (1.753 m)     Head Circumference --      Peak Flow --      Pain Score 04/02/18 1305 1     Pain Loc --      Pain Edu? --      Excl. in GC? --    Constitutional: Alert and oriented. Well appearing and in no acute distress. Neck: No stridor.  Cardiovascular: Normal rate, regular rhythm. Grossly normal heart sounds.  Good peripheral circulation. Respiratory: Normal respiratory effort.  No retractions. Lungs CTAB. Neurologic:  Normal speech and language. No gross focal neurologic deficits are appreciated. No gait instability. Skin: Laceration to the distal fourth  digit left hand.  Skin is erythematous and edematous.   Psychiatric: Mood and affect are normal. Speech and behavior are normal.  ____________________________________________   LABS (all labs ordered are listed, but only abnormal results are displayed)  Labs Reviewed - No data to display ____________________________________________  EKG   ____________________________________________  RADIOLOGY  ED MD interpretation:    Official radiology report(s): No results found.  ____________________________________________   PROCEDURES  Procedure(s) performed:  None  Procedures  Critical Care performed: No  ____________________________________________   INITIAL IMPRESSION / ASSESSMENT AND PLAN / ED COURSE  As part of my medical decision making, I reviewed the following data within the electronic MEDICAL RECORD NUMBER    66-day-old laceration to the fourth digit left hand.  Discussed with patient rationale for not suturing wound at this time.  Wound was copiously irrigated and clean.  Sterile dressing applied.  Patient given a tetanus shot.  Patient given discharge care instructions and a prescription for Bactrim and tramadol.  Patient advised to follow-up with PCP as needed.      ____________________________________________   FINAL CLINICAL IMPRESSION(S) / ED DIAGNOSES  Final diagnoses:  Laceration of left ring finger without foreign body without damage to nail, initial encounter     ED Discharge Orders        Ordered    sulfamethoxazole-trimethoprim (BACTRIM DS,SEPTRA DS) 800-160 MG tablet  2 times daily     04/02/18 1436    traMADol (ULTRAM) 50 MG tablet  Every 12 hours PRN     04/02/18 1436       Note:  This document was prepared using Dragon voice recognition software and may include unintentional dictation errors.    Joni Reining, PA-C 04/02/18 1439    Minna Antis, MD 04/02/18 1530

## 2018-04-02 NOTE — ED Triage Notes (Signed)
Laceration to 4th digit left hand on Saturday. Cut with knife. Unsure when last Tdap was.  Ambulatory.  Laceration is to tip of finger. No bleeding.

## 2018-04-02 NOTE — Discharge Instructions (Addendum)
Advised patient wound is too old to close at this time.  Wound will heal by granulation from the inside out.  Follow discharge care instruction and take medication as directed.

## 2018-04-02 NOTE — ED Notes (Addendum)
As pt placed in flex wait would also like to be seen for his back pain. Chronic back pain. Reports saw PCP and got inflammatory medicine but not helping.

## 2018-10-27 IMAGING — MR MR CERVICAL SPINE W/O CM
5 series · 29 of 48 positions shown · non-contrast
Comparison: Radiographs dated 07/07/2012

CLINICAL DATA: Neck pain and bilateral upper extremity pain.
Numbness in the hands.

EXAM:
MRI CERVICAL SPINE WITHOUT CONTRAST
TECHNIQUE: Multiplanar, multisequence MR imaging of the cervical spine was
performed. No intravenous contrast was administered.

[Series 3: T2 · sagittal · 3.0mm · 0.66mm/px · 7 of 15 slices shown (1 of 2)]
[im 1/15]
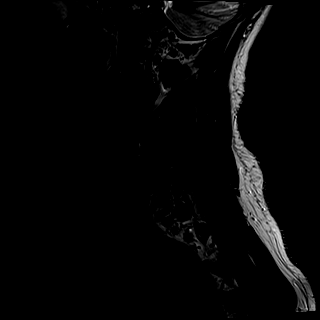
[im 3/15]
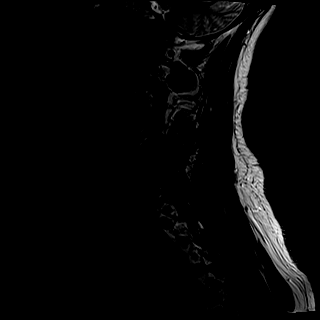
[im 5/15]
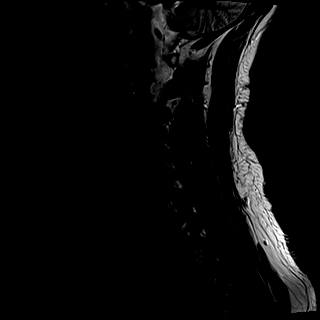
[im 8/15]
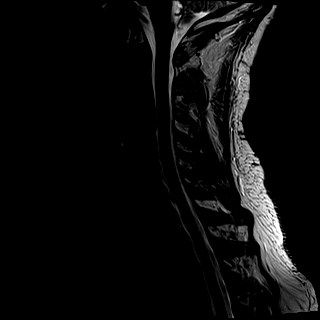
[im 10/15]
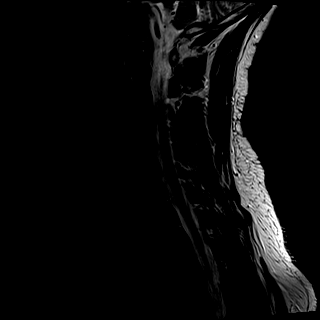
[im 12/15]
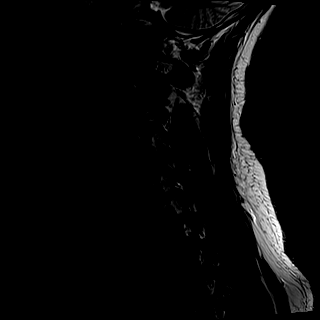
[im 15/15]
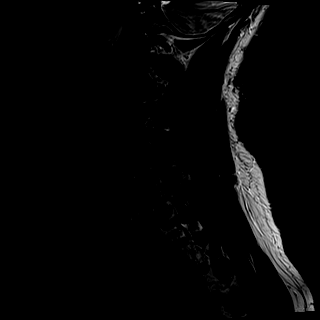

[Series 4: T1 · sagittal · 3.0mm · 0.41mm/px · 6 of 15 slices shown]
[im 1/15]
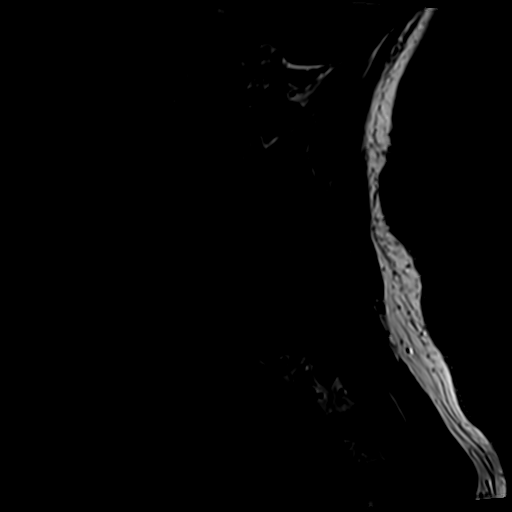
[im 3/15]
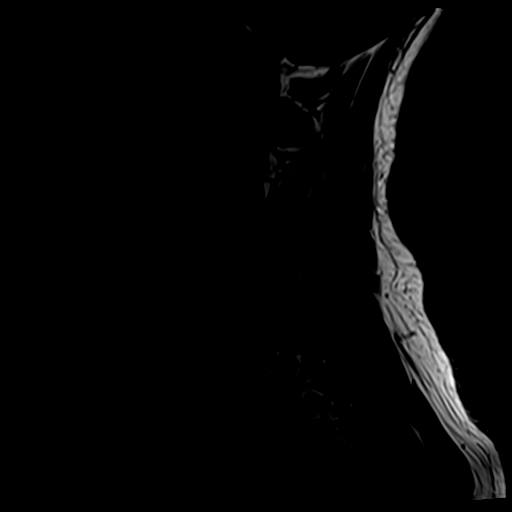
[im 6/15]
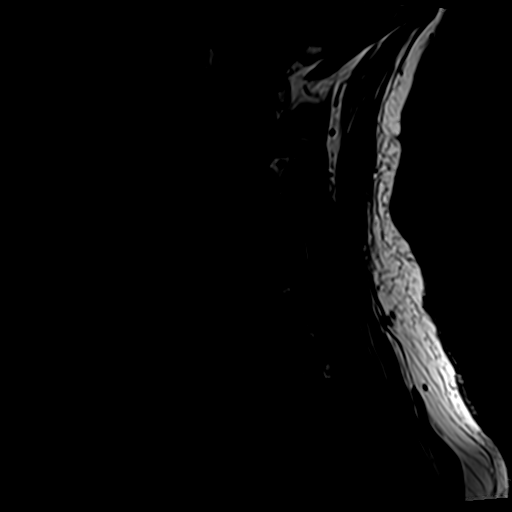
[im 9/15]
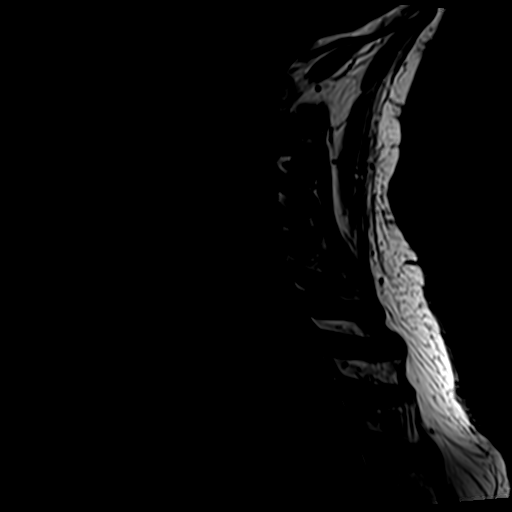
[im 12/15]
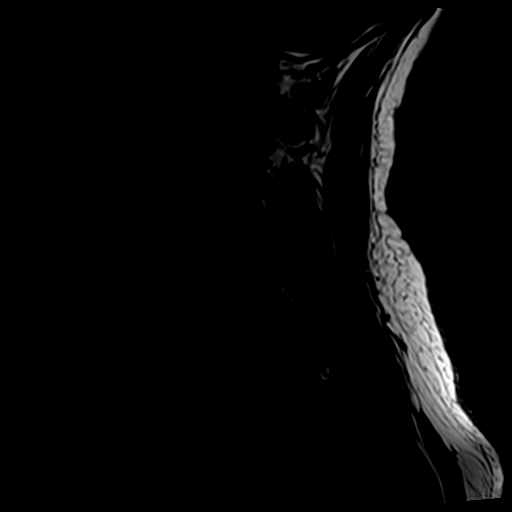
[im 15/15]
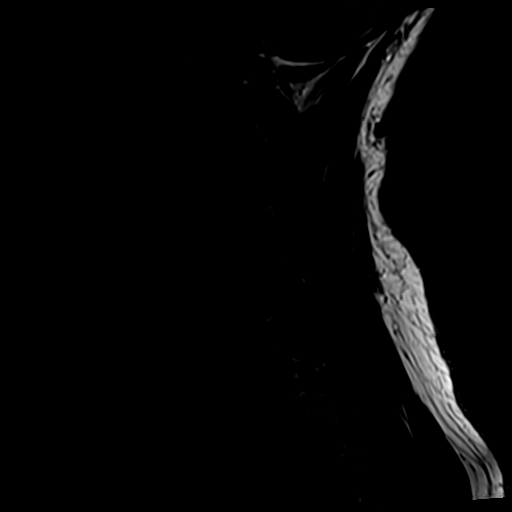

[Series 5: tir sag · sagittal · 3.0mm · 0.41mm/px · 6 of 15 slices shown]
[im 1/15]
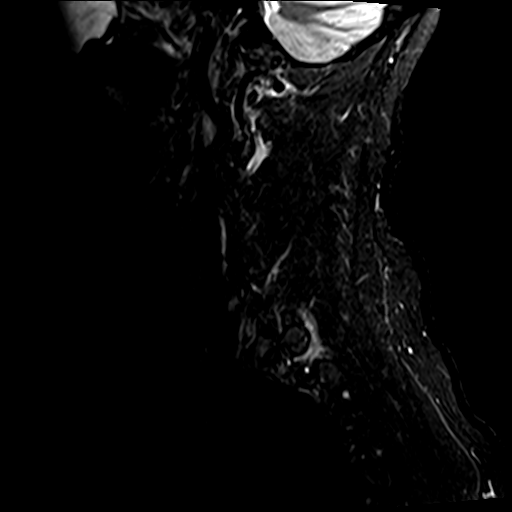
[im 3/15]
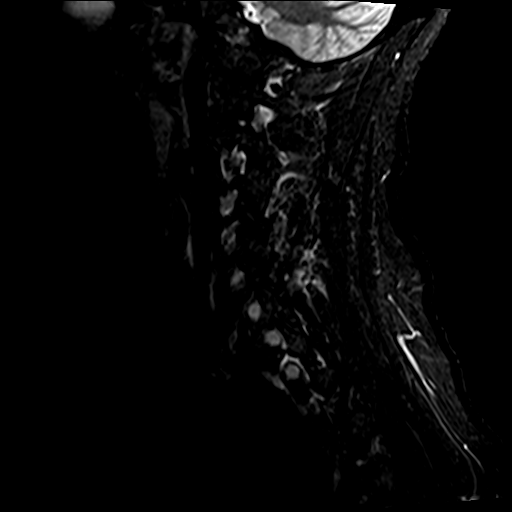
[im 6/15]
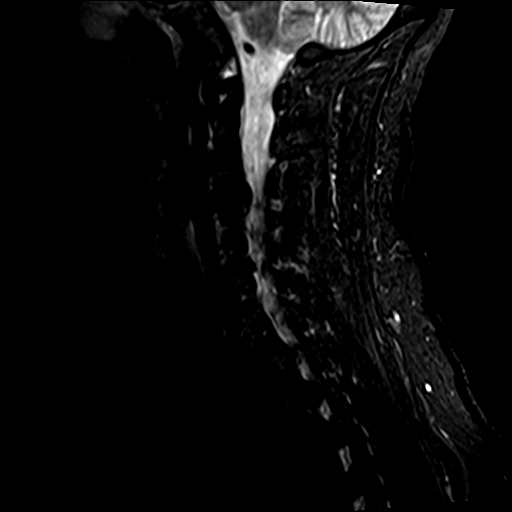
[im 9/15]
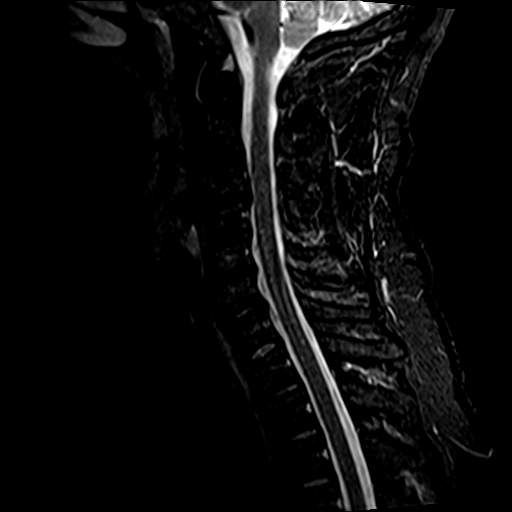
[im 12/15]
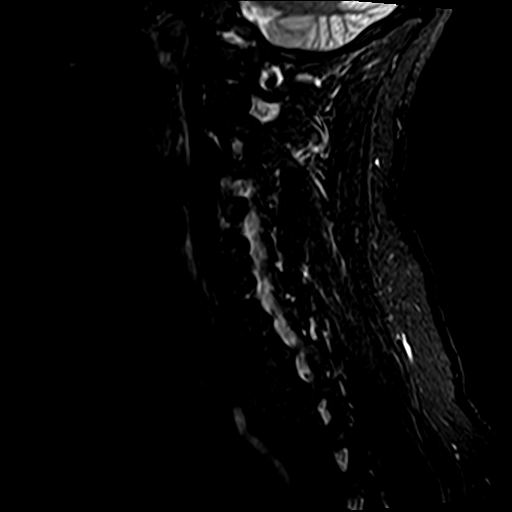
[im 15/15]
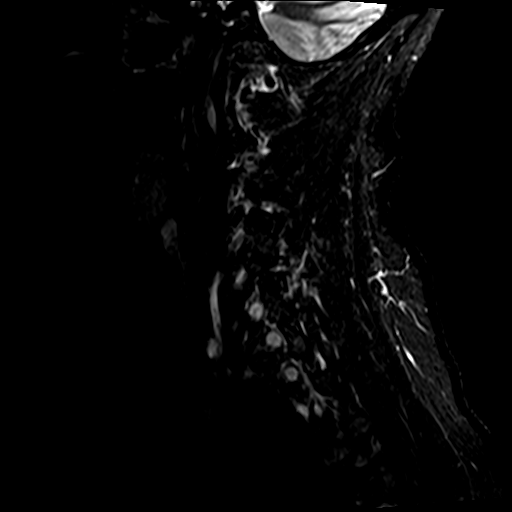

[Series 6: GRE · axial · 3.0mm · 0.35mm/px · z∈[-19,-0]mm · 2 of 36 slices shown]
[im 1/36]
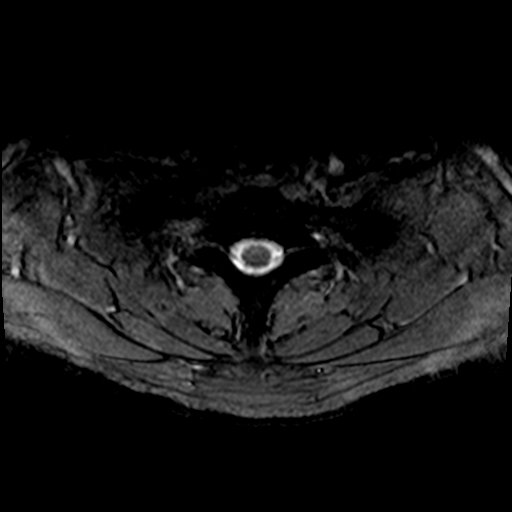
[im 6/36]
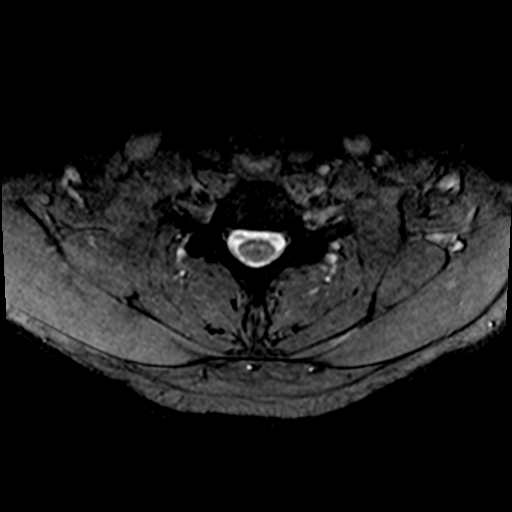

[Series 7: T2 · axial · 3.0mm · 0.70mm/px · z∈[-19,+111]mm · 8 of 35 slices shown (2 of 2)]
[im 1/35]
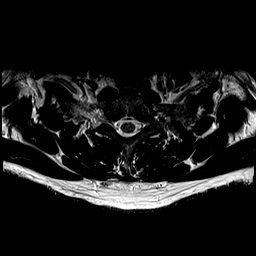
[im 6/35]
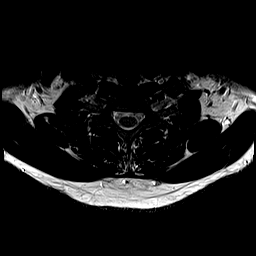
[im 11/35]
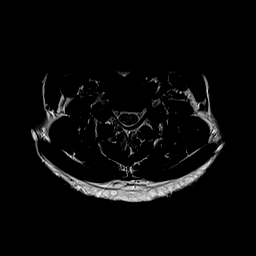
[im 16/35]
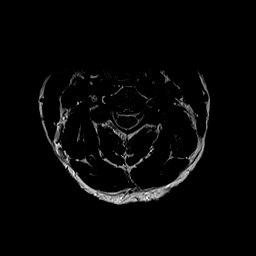
[im 19/35]
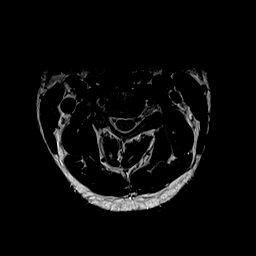
[im 24/35]
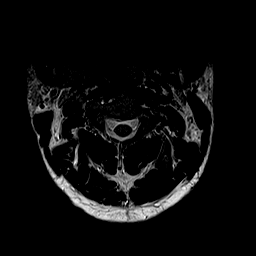
[im 29/35]
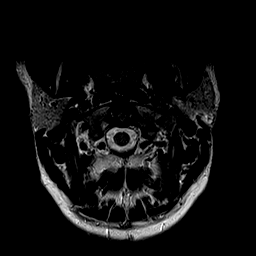
[im 35/35]
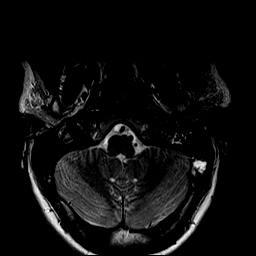

[29 of 48 positions shown; findings below may reference images not displayed]

FINDINGS: Alignment: Minimal retrolisthesis of C3 on C4.  Otherwise normal.

Vertebrae: No fracture, evidence of discitis, or bone lesion.

Cord: Normal signal and morphology.

Posterior Fossa, vertebral arteries, paraspinal tissues: Fluid in
the posterior aspect of the left mastoid air cells. Otherwise
negative.

Disc levels:

C2-3: Disc. Minimal degenerative changes of the left facet joint.
Widely patent neural foramina.

C3-4: Slight retrolisthesis of C3 on C4. Small broad-based disc
osteophyte complex asymmetric to the right with slight right
foraminal narrowing.

C4-5: Slight right facet arthritis.  Disc.

C5-6: Tiny broad-based disc osteophyte complex without neural
impingement. No foraminal stenosis. Minimal left facet arthritis.

C6-7: Tiny central disc bulge with no neural impingement. Widely
patent neural foramina.

C7-T1: Normal.
IMPRESSION: 1. Slight right foraminal narrowing at C3-4 due to uncinate spurs.
2. Minimal facet arthritis in the cervical spine.
3. Normal cervical spinal cord.

## 2018-11-27 ENCOUNTER — Ambulatory Visit (INDEPENDENT_AMBULATORY_CARE_PROVIDER_SITE_OTHER): Payer: BLUE CROSS/BLUE SHIELD | Admitting: Orthopaedic Surgery

## 2018-11-27 ENCOUNTER — Ambulatory Visit (INDEPENDENT_AMBULATORY_CARE_PROVIDER_SITE_OTHER): Payer: Self-pay

## 2018-11-27 ENCOUNTER — Encounter (INDEPENDENT_AMBULATORY_CARE_PROVIDER_SITE_OTHER): Payer: Self-pay | Admitting: Orthopaedic Surgery

## 2018-11-27 DIAGNOSIS — G8929 Other chronic pain: Secondary | ICD-10-CM

## 2018-11-27 DIAGNOSIS — M25562 Pain in left knee: Secondary | ICD-10-CM

## 2018-11-27 MED ORDER — METHYLPREDNISOLONE ACETATE 40 MG/ML IJ SUSP
40.0000 mg | INTRAMUSCULAR | Status: AC | PRN
  Administered 2018-11-27: 40 mg via INTRA_ARTICULAR

## 2018-11-27 MED ORDER — BUPIVACAINE HCL 0.5 % IJ SOLN
2.0000 mL | INTRAMUSCULAR | Status: AC | PRN
  Administered 2018-11-27: 2 mL via INTRA_ARTICULAR

## 2018-11-27 MED ORDER — LIDOCAINE HCL 1 % IJ SOLN
2.0000 mL | INTRAMUSCULAR | Status: AC | PRN
  Administered 2018-11-27: 2 mL
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
## 2021-08-13 ENCOUNTER — Other Ambulatory Visit: Payer: Self-pay

## 2021-08-13 DIAGNOSIS — F17210 Nicotine dependence, cigarettes, uncomplicated: Secondary | ICD-10-CM | POA: Insufficient documentation

## 2021-08-13 DIAGNOSIS — L02811 Cutaneous abscess of head [any part, except face]: Secondary | ICD-10-CM | POA: Diagnosis present

## 2021-08-13 NOTE — ED Triage Notes (Signed)
Bump to back of head x2-3days, denies fevers, 'i need it lanced'

## 2021-08-14 ENCOUNTER — Emergency Department
Admission: EM | Admit: 2021-08-14 | Discharge: 2021-08-14 | Disposition: A | Discharge status: Home / Self Care | Payer: 59 | Attending: Emergency Medicine | Admitting: Emergency Medicine

## 2021-08-14 DIAGNOSIS — L02811 Cutaneous abscess of head [any part, except face]: Secondary | ICD-10-CM

## 2021-08-14 MED ORDER — LIDOCAINE HCL (PF) 1 % IJ SOLN
5.0000 mL | Freq: Once | INTRAMUSCULAR | Status: DC
  Filled 2021-08-14: qty 5

## 2021-08-14 MED ORDER — SULFAMETHOXAZOLE-TRIMETHOPRIM 800-160 MG PO TABS: 1.0000 | ORAL_TABLET | Freq: Two times a day (BID) | ORAL | 0 refills | Status: DC

## 2021-08-14 NOTE — ED Notes (Signed)
Pt NAD, a/ox4. Pt verbalizes understanding of all DC and f/u instructions. All questions answered. Pt walks with steady gait to lobby 

## 2021-08-14 NOTE — ED Provider Notes (Signed)
[facility] Emergency Department Provider Note  ____________________________________________   Event Date/Time   First MD Initiated Contact with Patient 08/14/21 0013     (approximate)  I have reviewed the triage vital signs and the nursing notes.   HISTORY  Chief Complaint Abscess    HPI Ray Gonzalez is a 45 y.o. male on Suboxone chronically who presents to the emergency department with complaints of an abscess to the back of his head for the past couple of days.  States he has been increasing in size and not draining.  He is hoping that we can perform an incision and drainage today.  States he also has multiple other small scattered abscesses as well.  No history of diabetes, HIV.  No fevers, vomiting.        Past Medical History:  Diagnosis Date   Arm pain    Back pain    Neck pain     There are no problems to display for this patient.   Past Surgical History:  Procedure Laterality Date   HERNIA REPAIR     KNEE SURGERY     left    Prior to Admission medications   Medication Sig Start Date End Date Taking? Authorizing Provider  Buprenorphine HCl-Naloxone HCl (SUBOXONE SL) Place under the tongue.   Yes [provider]  sulfamethoxazole-trimethoprim (BACTRIM DS) 800-160 MG tablet Take 1 tablet by mouth 2 (two) times daily. 08/14/21  Yes Alante Tolan, Layla Maw, DO    Allergies Mobic [meloxicam] and Penicillins  No family history on file.  Social History Social History   Tobacco Use   Smoking status: Every Day    Types: Cigarettes   Smokeless tobacco: Never  Substance Use Topics   Alcohol use: No   Drug use: No    Review of Systems Constitutional: No fever. Eyes: No visual changes. ENT: No sore throat. Cardiovascular: Denies chest pain. Respiratory: Denies shortness of breath. Gastrointestinal: No nausea, vomiting, diarrhea. Genitourinary: Negative for dysuria. Musculoskeletal: Negative for back pain. Skin:  Negative for rash. Neurological: Negative for focal weakness or numbness.  ____________________________________________   PHYSICAL EXAM:  VITAL SIGNS: ED Triage Vitals [08/13/21 2015]  Enc Vitals Group     BP (!) 134/95     Pulse Rate 85     Resp      Temp 99.4 F (37.4 C)     Temp Source Oral     SpO2 98 %     Weight 209 lb (94.8 kg)     Height 5\' 9"  (1.753 m)     Head Circumference      Peak Flow      Pain Score      Pain Loc      Pain Edu?      Excl. in GC?    CONSTITUTIONAL: Alert and oriented and responds appropriately to questions. Well-appearing; well-nourished HEAD: Normocephalic EYES: Conjunctivae clear, pupils appear equal, EOM appear intact ENT: normal nose; moist mucous membranes NECK: Supple, normal ROM CARD: RRR; S1 and S2 appreciated; no murmurs, no clicks, no rubs, no gallops RESP: Normal chest excursion without splinting or tachypnea; breath sounds clear and equal bilaterally; no wheezes, no rhonchi, no rales, no hypoxia or respiratory distress, speaking full sentences ABD/GI: Normal bowel sounds; non-distended; soft, non-tender, no rebound, no guarding, no peritoneal signs, no hepatosplenomegaly BACK: The back appears normal EXT: Normal ROM in all joints; no deformity noted, no edema; no cyanosis SKIN: Normal color for age and race; warm; no  rash on exposed skin, 1 cm fluctuant area to the posterior scalp without drainage and no surrounding redness or warmth; multiple other very small less than 1 cm abscesses without drainage to the neck and groin without surrounding cellulitis NEURO: Moves all extremities equally PSYCH: The patient's mood and manner are appropriate.  ____________________________________________   LABS (all labs ordered are listed, but only abnormal results are displayed)  Labs Reviewed - No data to display ____________________________________________  EKG   ____________________________________________  RADIOLOGY I, Kaidyn Hernandes, personally viewed and evaluated these images (plain radiographs) as part of my medical decision making, as well as reviewing the written report by the radiologist.  ED MD interpretation:    Official radiology report(s): No results found.  ____________________________________________   PROCEDURES  Procedure(s) performed (including Critical Care):  Procedures  INCISION AND DRAINAGE Performed by: Baxter Hire Arianne Klinge Consent: Verbal consent obtained. Risks and benefits: risks, benefits and alternatives were discussed Type: abscess  Body area: Posterior scalp  Anesthesia: local infiltration  Incision was made with a scalpel.  Local anesthetic: lidocaine 1% without epinephrine  Anesthetic total: 3 ml  Complexity: simple  Drainage: purulent  Drainage amount: small  Patient tolerance: Patient tolerated the procedure well with no immediate complications.   ____________________________________________   INITIAL IMPRESSION / ASSESSMENT AND PLAN / ED COURSE  As part of my medical decision making, I reviewed the following data within the electronic MEDICAL RECORD NUMBER Nursing notes reviewed and incorporated, Old chart reviewed, and Notes from prior ED visits         Patient here with an abscess to the posterior scalp.  He requested an incision and drainage versus antibiotics today.  I&D performed with very minimal purulent drainage.  He reports feeling better.  Discussed wound care instructions, supportive care instructions and return precautions.  Given he does have multiple other little scattered abscesses on exam that are not amendable to drainage, will discharge with Bactrim.  Discussed return precautions.  I feel he safe for discharge home.  At this time, I do not feel there is any life-threatening condition present. I have reviewed, interpreted and discussed all results (EKG, imaging, lab, urine as appropriate) and exam findings with patient/family. I have reviewed nursing  notes and appropriate previous records.  I feel the patient is safe to be discharged home without further emergent workup and can continue workup as an outpatient as needed. Discussed usual and customary return precautions. Patient/family verbalize understanding and are comfortable with this plan.  Outpatient follow-up has been provided as needed. All questions have been answered.  ____________________________________________   FINAL CLINICAL IMPRESSION(S) / ED DIAGNOSES  Final diagnoses:  Scalp abscess     ED Discharge Orders          Ordered    sulfamethoxazole-trimethoprim (BACTRIM DS) 800-160 MG tablet  2 times daily        08/14/21 0136            *Please note:  Ray Gonzalez was evaluated in Emergency Department on 08/14/2021 for the symptoms described in the history of present illness. He was evaluated in the context of the global COVID-19 pandemic, which necessitated consideration that the patient might be at risk for infection with the SARS-CoV-2 virus that causes COVID-19. Institutional protocols and algorithms that pertain to the evaluation of patients at risk for COVID-19 are in a state of rapid change based on information released by regulatory bodies including the CDC and federal and state organizations. These policies and algorithms were  followed during the patient's care in the ED.  Some ED evaluations and interventions may be delayed as a result of limited staffing during and the pandemic.*   Note:  This document was prepared using Dragon voice recognition software and may include unintentional dictation errors.    Temara Lanum, Layla Maw, DO 08/14/21 [phone]

## 2021-08-14 NOTE — Discharge Instructions (Addendum)
You may alternate Tylenol 1000 mg every 6 hours as needed for pain, fever and Ibuprofen 800 mg every 8 hours as needed for pain, fever.  Please take Ibuprofen with food.  Do not take more than 4000 mg of Tylenol (acetaminophen) in a 24 hour period.  

## 2021-12-23 ENCOUNTER — Ambulatory Visit (INDEPENDENT_AMBULATORY_CARE_PROVIDER_SITE_OTHER): Payer: 59

## 2021-12-23 ENCOUNTER — Ambulatory Visit: Payer: 59 | Admitting: Podiatry

## 2021-12-23 ENCOUNTER — Other Ambulatory Visit: Payer: Self-pay

## 2021-12-23 DIAGNOSIS — M205X2 Other deformities of toe(s) (acquired), left foot: Secondary | ICD-10-CM | POA: Diagnosis not present

## 2021-12-23 DIAGNOSIS — M7752 Other enthesopathy of left foot: Secondary | ICD-10-CM

## 2021-12-23 MED ORDER — METHYLPREDNISOLONE 4 MG PO TBPK: ORAL_TABLET | ORAL | 0 refills | Status: DC

## 2021-12-23 MED ORDER — MELOXICAM 15 MG PO TABS: 15.0000 mg | ORAL_TABLET | Freq: Every day | ORAL | 0 refills | Status: AC

## 2021-12-28 ENCOUNTER — Encounter: Payer: Self-pay | Admitting: Podiatry

## 2021-12-28 NOTE — Progress Notes (Signed)
Subjective:  Patient ID: Ray Gonzalez, male    DOB: 22-Aug-1976,  MRN: [id number]  Chief Complaint  Patient presents with   Toe Pain    Left foot pain around the big toe joint  3-4 days pain came out of no where  Hard to put pressure on his foot has to walk on the side of his foot     46 y.o. male presents with the above complaint. Patient presents with pain around the first metatarsophalangeal joint.  Patient states that unable to put any pressure has to walk on the side of the foot.  Is been for 3 to 4 days.  He does not have any history of gout.  He has not seen MRIs prior to seeing me.  He would like to discuss treatment options for this.  He denies any other issues.  Pain scale is 10 out of 10 hurts with ambulation   Review of Systems: Negative except as noted in the HPI. Denies N/V/F/Ch.  Past Medical History:  Diagnosis Date   Arm pain    Back pain    Neck pain     Current Outpatient Medications:    meloxicam (MOBIC) 15 MG tablet, Take 1 tablet (15 mg total) by mouth daily., Disp: 30 tablet, Rfl: 0   methylPREDNISolone (MEDROL DOSEPAK) 4 MG TBPK tablet, TAKE AS DIRECTED, Disp: 1 each, Rfl: 0   Buprenorphine HCl-Naloxone HCl (SUBOXONE SL), Place under the tongue., Disp: , Rfl:    sulfamethoxazole-trimethoprim (BACTRIM DS) 800-160 MG tablet, Take 1 tablet by mouth 2 (two) times daily., Disp: 14 tablet, Rfl: 0  Social History   Tobacco Use  Smoking Status Every Day   Types: Cigarettes  Smokeless Tobacco Never    Allergies  Allergen Reactions   Mobic [Meloxicam] Diarrhea and Nausea And Vomiting   Penicillins Hives   Objective:  There were no vitals filed for this visit. There is no height or weight on file to calculate BMI. Constitutional Well developed. Well nourished.  Vascular Dorsalis pedis pulses palpable bilaterally. Posterior tibial pulses palpable bilaterally. Capillary refill normal to all digits.  No cyanosis or clubbing noted. Pedal hair  growth normal.  Neurologic Normal speech. Oriented to person, place, and time. Epicritic sensation to light touch grossly present bilaterally.  Dermatologic Nails well groomed and normal in appearance. No open wounds. No skin lesions.  Orthopedic: Pain on palpation left first metatarsophalangeal joint.  Pain with range of motion of the MPJ.  Deep intra-articular pain noted.  Crepitus could be clinically appreciated at the first MPJ joint.  No pain at the sesamoidal complex   Radiographs: 3 views of skeletally mature adult left foot: No osteoarthritic changes noted of the first metatarsophalangeal joint.  Changes noted to the first metatarsophalangeal joint consistent with hallux limitus.  No other bony abnormalities identified Assessment:   1. Capsulitis of metatarsophalangeal (MTP) joint of left foot   2. Hallux limitus of left foot    Plan:  Patient was evaluated and treated and all questions answered.  Left first MPJ capsulitis with underlying hallux limitus -All questions and concerns were discussed with the patient in extensive detail.  I discussed treatment options with the patient. -I explained to the patient in detail about the etiology of hallux limitus and various treatment options were discussed. -Given the amount of pain that is having I believe benefit from a steroid injection help decrease acute inflammatory component associated with pain.  Patient agrees with plan like to proceed with steroid injection. -  A steroid injection was performed at Left first MPJ using 1% plain Lidocaine and 10 mg of Kenalog. This was well tolerated.   No follow-ups on file.

## 2022-01-18 ENCOUNTER — Ambulatory Visit: Payer: 59 | Admitting: Podiatry

## 2022-06-07 ENCOUNTER — Telehealth: Payer: Self-pay

## 2022-06-07 NOTE — Telephone Encounter (Signed)
OUD Intake:  Drug of choice: Heroin (Has been clean from all drugs x 6 years)  How long: 12 years  Other substances: "Opiates, did everything except cocaine and crystal meth."  Currently on suboxone: Yes, 1 film BID  Interested in treatment: Yes  Around users.: "No, cut all those people out."  Social support: "No, don't need it, I deal with my own problems."   Other: Has received treatment from [facility], [facility], and [facility] in past. Currently receiving suboxone from Merryl Hacker, NP-C with Pain Medicine.   Front office scheduled patient for OUD clinic 6/27 at 0945. He was given detailed directions to clinic.

## 2022-06-07 NOTE — Telephone Encounter (Signed)
New OUD pt was given 6/27 @ 945  OUD clinic  .Marland Kitchen He is transferring  from  [facility] on [address] he got his refill for his strips today 6/13 .Marland Kitchen He stated he will still have some left due to it is a 28 day supply but he is wanting to still come in to  the appt and start care here with [place]

## 2022-06-21 ENCOUNTER — Other Ambulatory Visit: Payer: Self-pay

## 2022-06-21 ENCOUNTER — Ambulatory Visit: Payer: 59 | Admitting: Student

## 2022-06-21 VITALS — BP 113/67 | HR 78 | Temp 98.0°F | Wt 206.4 lb

## 2022-06-21 DIAGNOSIS — F1190 Opioid use, unspecified, uncomplicated: Secondary | ICD-10-CM | POA: Insufficient documentation

## 2022-06-21 DIAGNOSIS — M79675 Pain in left toe(s): Secondary | ICD-10-CM | POA: Insufficient documentation

## 2022-06-21 DIAGNOSIS — F1990 Other psychoactive substance use, unspecified, uncomplicated: Secondary | ICD-10-CM | POA: Diagnosis not present

## 2022-06-21 DIAGNOSIS — Z79891 Long term (current) use of opiate analgesic: Secondary | ICD-10-CM

## 2022-06-21 DIAGNOSIS — F1121 Opioid dependence, in remission: Secondary | ICD-10-CM | POA: Insufficient documentation

## 2022-06-21 MED ORDER — BUPRENORPHINE HCL-NALOXONE HCL 8-2 MG SL FILM: ORAL_FILM | SUBLINGUAL | 0 refills | Status: DC

## 2022-06-23 NOTE — Progress Notes (Signed)
Internal Medicine Clinic Attending  I saw and evaluated the patient.  I personally confirmed the key portions of the history and exam documented by Dr. Amponsah and I reviewed pertinent patient test results.  The assessment, diagnosis, and plan were formulated together and I agree with the documentation in the resident's note.  

## 2022-06-23 NOTE — Addendum Note (Signed)
Addended by: Erlinda Hong T on: 06/23/2022 09:02 AM   Modules accepted: Level of Service

## 2022-06-24 LAB — TOXASSURE SELECT,+ANTIDEPR,UR

## 2022-07-01 ENCOUNTER — Ambulatory Visit (INDEPENDENT_AMBULATORY_CARE_PROVIDER_SITE_OTHER): Payer: 59 | Admitting: Student

## 2022-07-01 DIAGNOSIS — Z79891 Long term (current) use of opiate analgesic: Secondary | ICD-10-CM

## 2022-07-01 MED ORDER — BUPRENORPHINE HCL-NALOXONE HCL 8-2 MG SL FILM: ORAL_FILM | SUBLINGUAL | 0 refills | Status: DC

## 2022-07-01 NOTE — Progress Notes (Signed)
Patient had scheduled appointment for earlier today however he missed his appointment and unfortunately showed up later in the day.  I was unable to see him for a full appointment and he had 1 more day of Suboxone.  He denies any symptoms of cravings or withdrawals.  He is doing well on the current regimen.  He states that he has been on it for some time however he wanted to change providers.  We will write him for 6 tabs, which is enough for him to have until Monday when he can be seen in our clinic for further evaluation.  Of note he has 1 tablet left for this evening.

## 2022-07-04 ENCOUNTER — Encounter: Payer: Self-pay | Admitting: Student

## 2022-07-04 ENCOUNTER — Ambulatory Visit: Payer: 59 | Admitting: Student

## 2022-07-04 VITALS — BP 111/73 | HR 73 | Temp 98.1°F | Ht 69.0 in | Wt 204.0 lb

## 2022-07-04 DIAGNOSIS — R21 Rash and other nonspecific skin eruption: Secondary | ICD-10-CM | POA: Insufficient documentation

## 2022-07-04 DIAGNOSIS — Z79891 Long term (current) use of opiate analgesic: Secondary | ICD-10-CM | POA: Diagnosis not present

## 2022-07-04 DIAGNOSIS — F1121 Opioid dependence, in remission: Secondary | ICD-10-CM

## 2022-07-04 NOTE — Patient Instructions (Addendum)
Thank you, Ray Gonzalez for allowing [place] to provide your care today. Today we discussed.  OUD Please follow up in one month. Please call if you have any signs of cravings or withdrawal symptoms.   Rash If you notice a rash on your genitals, please call our clinic to be seen. Avoid sexual contact when the rash occurs    I have ordered the following labs for you:   Lab Orders         ToxAssure Select,+Antidepr,UR       Referrals ordered today:   Referral Orders  No referral(s) requested today     I have ordered the following medication/changed the following medications:   Stop the following medications: There are no discontinued medications.   Start the following medications: No orders of the defined types were placed in this encounter.    Follow up:  1 month     Should you have any questions or concerns please call the internal medicine clinic at [phone].    Thalia Bloodgood, D.O. [facility]  .[email or web address]

## 2022-07-04 NOTE — Assessment & Plan Note (Signed)
Assessment: Patient notes that he has a history of a vesicular rash that occurs on his genitalia approximately 3 times a year for the past few years.  The area is not painful and nonpruritic.  He does not have any rash at this time.  We discussed further that if these do occur again he should present to the internal medicine clinic to be evaluated in person.  Also recommended when this does happen, he should not have intercourse as there is risk of this is herpes simplex virus.  He denies prior diagnosis of herpes simplex virus.  Plan: -Follow-up if rash occurs again

## 2022-07-04 NOTE — Assessment & Plan Note (Addendum)
Assessment: Patient presents for OUD follow up. He has been stable on his current dose of suboxone, without symptoms of withdrawal or increased cravings. He is doing well and does not feel as though he wants to use other substances. Denies life stressors. Has tried counseling in the past and is not interested at this time.  He thought his appointment was later in the day on Friday and missed his morning appointment.  He was given enough Suboxone to make it to his appointment today.  However, when he went to the pharmacy last week he was given a prescription for a month supply of Suboxone.  Further review of the PDMP and speaking with the pharmacy it appears as though his prior provider had a prescription on hold that was then filled.  This prescription was written on April 2023 but filled last Friday.  We discussed moving forward only having medications filled by 1 provider.  He agreed to this and is uncertain as to what happened.  Pain contract reviewed and signed by patient myself.  We will work on getting prior records from [employer].  Plan: -pain contract signed -follow up tox-assure -obtain [facility] records -follow up one month  Addendum: UDS appropriate

## 2022-07-04 NOTE — Progress Notes (Signed)
07/04/2022  Ray Gonzalez presents for follow up of opioid use disorder I have reviewed the prior induction visit, follow up visits, and telephone encounters relevant to opiate use disorder (OUD) treatment.   Current daily dose: 8-2mg  films BID  Date of Induction: 06/21/22 with our clinic. Prior followed by [facility]  Current follow up interval, in weeks: Last seen 2 weeks ago  The patient has been adherent with the buprenorphine for OUD contract.   Last UDS Result: Suboxone and EtOH  HPI:   Mr Habenicht has followed up after starting suboxone treatment at our clinic. He was followed by [facility] center and requested to switch to a new clinic. He unfortunatley missed his follow up appointment last Friday and has to reschedule for today. He was out of suboxone and I wrote him a prescription for 6 tabs to get him to our appointment today.   When he went to the pharmacy to pickup the prescription 3 days ago, they gave him a one month supply. It appears the pharmacy filled a on hold prescription of suboxone from his prior NP at [facility] that was written in April 2023. I called and spoke with the pharmacy who confirmed this. We reviewed the pain contract and he agreed to our contract. We discussed moving forward only seeing one physician for suboxone prescriptions. HE agreed to this. He denied any cravings or withdrawal symptoms. He denied feeling as though his current dose was not effective.   He also informed me that at times he has a raised non-pruritic, non painful vesicle that erupts on his genitalia. This has happened the past few years, occurring 2-3 times per year. He denies penile discharge or dysuria. Denies history of STI or herpes. When showing images online he feels   Exam:   Vitals:   07/04/22 0857  BP: 111/73  Pulse: 73  Temp: 98.1 F (36.7 C)  TempSrc: Oral  SpO2: 100%  Weight: 204 lb (92.5 kg)  Height: 5\' 9"  (1.753 m)   Constitutional:  well-appearing, in no acute distress HENT: normocephalic atraumatic Eyes: conjunctiva non-erythematous Neck: supple Pulmonary/Chest: normal work of breathing on room air MSK: normal bulk and tone. Neurological: Gonzalez & oriented x 3 Skin: warm and dry Psych: normal mood  Assessment/Plan:   Long term (current) use of opiate analgesic Assessment: Patient presents for OUD follow up. He has been stable on his current dose of suboxone, without symptoms of withdrawal or increased cravings. He is doing well and does not feel as though he wants to use other substances. Denies life stressors. Has tried counseling in the past and is not interested at this time.  He thought his appointment was later in the day on Friday and missed his morning appointment.  He was given enough Suboxone to make it to his appointment today.  However, when he went to the pharmacy last week he was given a prescription for a month supply of Suboxone.  Further review of the PDMP and speaking with the pharmacy it appears as though his prior provider had a prescription on hold that was then filled.  This prescription was written on April 2023 but filled last Friday.  We discussed moving forward only having medications filled by 1 provider.  He agreed to this and is uncertain as to what happened.  Pain contract reviewed and signed by patient myself.  We will work on getting prior records from Tuesday.  Plan: -pain contract signed -follow up tox-assure -obtain [facility]  medical records -follow up one month  Rash of genital area Assessment: Patient notes that he has a history of a vesicular rash that occurs on his genitalia approximately 3 times a year for the past few years.  The area is not painful and nonpruritic.  He does not have any rash at this time.  We discussed further that if these do occur again he should present to the internal medicine clinic to be evaluated in person.  Also recommended when this does happen,  he should not have intercourse as there is risk of this is herpes simplex virus.  He denies prior diagnosis of herpes simplex virus.  Plan: -Follow-up if rash occurs again    Belva Agee, MD  07/04/2022  8:25 PM

## 2022-07-06 NOTE — Progress Notes (Signed)
Internal Medicine Clinic Attending  Case discussed with Dr. Katsadouros  At the time of the visit.  We reviewed the resident's history and exam and pertinent patient test results.  I agree with the assessment, diagnosis, and plan of care documented in the resident's note.  

## 2022-07-07 LAB — TOXASSURE SELECT,+ANTIDEPR,UR

## 2022-07-28 ENCOUNTER — Ambulatory Visit: Payer: 59 | Admitting: Student

## 2022-07-28 VITALS — BP 133/82 | HR 82 | Temp 98.2°F | Wt 199.8 lb

## 2022-07-28 DIAGNOSIS — J310 Chronic rhinitis: Secondary | ICD-10-CM

## 2022-07-28 DIAGNOSIS — F1111 Opioid abuse, in remission: Secondary | ICD-10-CM | POA: Diagnosis not present

## 2022-07-28 DIAGNOSIS — Z79891 Long term (current) use of opiate analgesic: Secondary | ICD-10-CM

## 2022-07-28 DIAGNOSIS — J329 Chronic sinusitis, unspecified: Secondary | ICD-10-CM

## 2022-07-28 MED ORDER — BUPRENORPHINE HCL-NALOXONE HCL 8-2 MG SL FILM: 1.0000 | ORAL_FILM | Freq: Two times a day (BID) | SUBLINGUAL | 0 refills | Status: DC

## 2022-07-28 MED ORDER — FLUTICASONE PROPIONATE 50 MCG/ACT NA SUSP: 1.0000 | Freq: Every day | NASAL | 1 refills | Status: DC

## 2022-07-28 NOTE — Patient Instructions (Signed)
Thank you, [name] for allowing [place] to provide your care today. Today we discussed your Suboxone therapy and history of sinusitis.  I have started you on Flonase.  I have also refilled your Suboxone for 4 more weeks.  I have ordered the following labs for you:  Lab Orders         ToxAssure Select,+Antidepr,UR       I will call if any are abnormal. All of your labs can be accessed through "My Chart".   I have ordered the following medication/changed the following medications:  Start Flonase 1 spray into each nares daily  My Chart Access: [email or web address]  Please follow-up in 4 weeks for OUD clinic  Please make sure to arrive 15 minutes prior to your next appointment. If you arrive late, you may be asked to reschedule.    We look forward to seeing you next time. Please call our clinic at [phone] if you have any questions or concerns. The best time to call is Monday-Friday from 9am-4pm, but there is someone available 24/7. If after hours or the weekend, call the main hospital number and ask for the Internal Medicine Resident On-Call. If you need medication refills, please notify your pharmacy one week in advance and they will send [place] a request.   Thank you for letting us take part in your care. Wishing you the best!  Ray Rainwater, MD 07/28/2022, 4:10 PM IM Resident, PGY-3 Ray Gonzalez 41:10

## 2022-07-28 NOTE — Progress Notes (Signed)
   07/28/2022  Ray Gonzalez presents for follow up of opioid use disorder I have reviewed the prior induction visit, follow up visits, and telephone encounters relevant to opiate use disorder (OUD) treatment.   Current daily dose: Suboxone 8-2 mg film, 2 films daily  Date of Induction: 07/04/2022  Current follow up interval, in weeks: 4 weeks  The patient has been adherent with the buprenorphine for OUD contract.   Last UDS Result: 07/04/2022, appropriate  HPI: Ray Gonzalez has been doing well on the current Suboxone therapy.  He signed the pain contract last month.  He denies any cravings, withdrawals or recreational drug use.  He does report stuffy nose recently due to his chronic sinusitis. Denies any fevers, nasal drainage, sore throat or sinus pain. States he met a new girl online recently and has been going well.  He recently had multiple first-degree burns in his arms while at work but they have been healing appropriately.  Exam:   Vitals:   07/28/22 1552  BP: 133/82  Pulse: 82  Temp: 98.2 F (36.8 C)  TempSrc: Oral  SpO2: 98%  Weight: 199 lb 12.8 oz (90.6 kg)   General: Pleasant, well-appearing middle-age man laying in bed. No acute distress. HEENT: Mild erythema and swelling of the nasal mucosa.  No tenderness to palpation of the sinuses. CV: RRR. No murmurs, rubs, or gallops. No LE edema Pulmonary: Lungs CTAB. Normal effort. No wheezing or rales. Extremities: Palpable radial and DP pulses. Normal ROM. Skin: Warm and dry. 2-3 small, healing superficial burns on both arms. Neuro: A&Ox3. Moves all extremities. Normal sensation. No focal deficit. Psych: Normal mood and affect   Assessment/Plan:  See Problem Based Charting in the Encounters Tab     Lacinda Axon, MD  07/28/2022  3:58 PM

## 2022-07-29 ENCOUNTER — Encounter: Payer: Self-pay | Admitting: Student

## 2022-07-29 DIAGNOSIS — J329 Chronic sinusitis, unspecified: Secondary | ICD-10-CM | POA: Insufficient documentation

## 2022-07-29 DIAGNOSIS — J310 Chronic rhinitis: Secondary | ICD-10-CM | POA: Insufficient documentation

## 2022-07-29 NOTE — Assessment & Plan Note (Addendum)
Patient states he has a history of rhinosinusitis and was previously on Flonase but has not needed it in years.  States over the last week or so, his nasal become more congested and stuffy. He denies any fevers, chills, cough, nasal drainage or sinus pain.  On exam, patient has mild to moderate erythema and swelling of the nasal mucosa but no tenderness to palpation of the sinuses.  Plan: -Restart Flonase 1 spray to both nostrils daily -Continue OTC decongestant

## 2022-07-29 NOTE — Progress Notes (Signed)
Internal Medicine Clinic Attending  Case discussed with Dr. Amponsah  At the time of the visit.  We reviewed the resident's history and exam and pertinent patient test results.  I agree with the assessment, diagnosis, and plan of care documented in the resident's note.  

## 2022-07-29 NOTE — Assessment & Plan Note (Signed)
Ray Gonzalez presents for his third OUD visit today.  He has been doing well overall.  He denies any cravings, withdrawals or use of recreational drugs.  He endorsed mild nasal congestion but no other cold symptoms.  He recently met a new girl online who lives in [place] so he is looking forward to seeing how that goes.   PDMP reviewed and is appropriate.  States he would like to switch his pharmacy to 1 that is close to him at his next OUD visit.  Plan: -Refilled Suboxone 8-2 mg, twice daily -Follow-up tox screen -OUD follow-up in 4 weeks

## 2022-08-03 LAB — TOXASSURE SELECT,+ANTIDEPR,UR

## 2022-08-25 ENCOUNTER — Ambulatory Visit: Payer: 59 | Admitting: Student

## 2022-08-25 VITALS — BP 134/81 | HR 74 | Wt 197.4 lb

## 2022-08-25 DIAGNOSIS — F1190 Opioid use, unspecified, uncomplicated: Secondary | ICD-10-CM | POA: Diagnosis not present

## 2022-08-25 DIAGNOSIS — Z79891 Long term (current) use of opiate analgesic: Secondary | ICD-10-CM | POA: Diagnosis not present

## 2022-08-25 MED ORDER — BUPRENORPHINE HCL-NALOXONE HCL 8-2 MG SL FILM: 1.0000 | ORAL_FILM | Freq: Two times a day (BID) | SUBLINGUAL | 0 refills | Status: DC

## 2022-08-25 MED ORDER — SENNA 8.6 MG PO TABS: 1.0000 | ORAL_TABLET | Freq: Every day | ORAL | 0 refills | Status: DC

## 2022-08-25 NOTE — Progress Notes (Unsigned)
   CC: OUD follow-up  HPI:  RayRay Gonzalez is a 46 y.o. person with opioid use disorder presenting to [facility] for OUD follow-up  Please see problem-based list for further details, assessments, and plans.  Past Medical History:  Diagnosis Date   Arm pain    Back pain    Neck pain    Review of Systems:  As per HPI  Physical Exam:  Vitals:   08/25/22 0857  BP: 134/81  Pulse: 74  Weight: 197 lb 6.4 oz (89.5 kg)   General: Resting comfortably in chair, no acute distress CV: Regular rate, rhythm. No murmurs appreciated. Warm extremities. Distal pulses 2+, equal. Pulm: Normal work of breathing on room air. Clear to ausculation bilaterally. MSK: Normal bulk, tone. No peripheral edema appreciated. Skin: Warm, dry. No rashes or lesions. Neuro: Awake, alert, conversing appropriately. Grossly non-focal. Psych: Normal mood, affect, speech.  Assessment & Plan:   Opioid use disorder Ray Gonzalez is presenting today for OUD follow-up. Was previously seen by our clinic one month ago. Reports since this time he has been doing well on his maintenance therapy. Denies cravings, withdrawals, or use of recreational drugs. He does report intermittent constipation, sometimes only having a bowel movement every 2-3 days. When this occurs he will get some abdominal discomfort. Denies current dental issues. We discussed his girlfriend, who currently lives in [place], and their current relationship as well.   It appears Ray Gonzalez is doing very well overall. PDMP has been reviewed, appropriate. He is not having any difficulty obtaining the medication. We will continue with current regimen, have him return in one month. Last three ToxAssures have been appropriate, will hold off today. Likely can increase interval of appointments if doing well next month.   - Continue suboxone 8-2mg  twice daily - Senna 8.6mg  daily as needed for constipation - OUD follow-up in four weeks, consider increasing interval  between appointments  Patient discussed with Dr. Sammuel Hines, MD Internal Medicine PGY-3 Pager: [phone]

## 2022-08-25 NOTE — Patient Instructions (Signed)
Mr.Barack Antonietta Barcelona, it was a pleasure seeing you today!  Today we discussed: - Please return in one month. I have given you some stool softeners to take as needed for constipation.   I have ordered the following medication/changed the following medications:   Start the following medications: Meds ordered this encounter  Medications   senna (SENOKOT) 8.6 MG TABS tablet    Sig: Take 1 tablet (8.6 mg total) by mouth daily.    Dispense:  30 tablet    Refill:  0   Buprenorphine HCl-Naloxone HCl 8-2 MG FILM    Sig: Place 1 Film under the tongue 2 (two) times daily.    Dispense:  56 each    Refill:  0     Follow-up:  1 month    Please make sure to arrive 15 minutes prior to your next appointment. If you arrive late, you may be asked to reschedule.   We look forward to seeing you next time. Please call our clinic at [phone] if you have any questions or concerns. The best time to call is Monday-Friday from 9am-4pm, but there is someone available 24/7. If after hours or the weekend, call the main hospital number and ask for the Internal Medicine Resident On-Call. If you need medication refills, please notify your pharmacy one week in advance and they will send [place] a request.  Thank you for letting us take part in your care. Wishing you the best!  Thank you, Evlyn Kanner, MD

## 2022-08-27 NOTE — Assessment & Plan Note (Signed)
Mr. Dutch is presenting today for OUD follow-up. Was previously seen by our clinic one month ago. Reports since this time he has been doing well on his maintenance therapy. Denies cravings, withdrawals, or use of recreational drugs. He does report intermittent constipation, sometimes only having a bowel movement every 2-3 days. When this occurs he will get some abdominal discomfort. Denies current dental issues. We discussed his girlfriend, who currently lives in [place], and their current relationship as well.   It appears Mr. Whiteford is doing very well overall. PDMP has been reviewed, appropriate. He is not having any difficulty obtaining the medication. We will continue with current regimen, have him return in one month. Last three ToxAssures have been appropriate, will hold off today. Likely can increase interval of appointments if doing well next month.   - Continue suboxone 8-2mg  twice daily - Senna 8.6mg  daily as needed for constipation - OUD follow-up in four weeks, consider increasing interval between appointments

## 2022-08-30 NOTE — Progress Notes (Signed)
Internal Medicine Clinic Attending ? ?Case discussed with Dr. Braswell  At the time of the visit.  We reviewed the resident?s history and exam and pertinent patient test results.  I agree with the assessment, diagnosis, and plan of care documented in the resident?s note.  ?

## 2022-09-08 ENCOUNTER — Ambulatory Visit (HOSPITAL_COMMUNITY)
Admission: RE | Admit: 2022-09-08 | Discharge: 2022-09-08 | Disposition: A | Discharge status: Home / Self Care | Payer: 59 | Source: Ambulatory Visit | Attending: Nurse Practitioner | Admitting: Nurse Practitioner

## 2022-09-08 ENCOUNTER — Ambulatory Visit (INDEPENDENT_AMBULATORY_CARE_PROVIDER_SITE_OTHER): Payer: 59 | Admitting: Nurse Practitioner

## 2022-09-08 ENCOUNTER — Encounter: Payer: Self-pay | Admitting: Nurse Practitioner

## 2022-09-08 VITALS — BP 125/84 | HR 75 | Temp 99.8°F | Ht 69.0 in | Wt 198.4 lb

## 2022-09-08 DIAGNOSIS — S6991XA Unspecified injury of right wrist, hand and finger(s), initial encounter: Secondary | ICD-10-CM | POA: Insufficient documentation

## 2022-09-08 MED ORDER — PREDNISONE 20 MG PO TABS: 20.0000 mg | ORAL_TABLET | Freq: Every day | ORAL | 0 refills | Status: AC

## 2022-09-08 NOTE — Patient Instructions (Addendum)
1. Hand injury, right, initial encounter  - DG Hand Complete Right - predniSONE (DELTASONE) 20 MG tablet; Take 1 tablet (20 mg total) by mouth daily with breakfast for 5 days.  Dispense: 5 tablet; Refill: 0   Follow up:  Follow up as needed

## 2022-09-08 NOTE — Progress Notes (Signed)
@Patient  ID: , male    DOB: 08-04-1976, 46 y.o.   MRN: [id number]  Chief Complaint  Patient presents with   Hand Pain    Pt had accident at work RT hand sharp pain tenderness and unable to grab the door knob    Referring provider: No ref. provider found   HPI  Patient presents today with right hand injury. Patient injured hand 3 weeks ago 08/11/22 at work -he works with high pressure equipment - tore large area in right hand at base of thumb.  He did go to [facility] and states that x-rays were negative at that time.  He states that he continues to have pain to the base of his thumb. Denies f/c/s, n/v/d, hemoptysis, PND, leg swelling Denies chest pain or edema      Allergies  Allergen Reactions   Mobic [Meloxicam] Diarrhea and Nausea And Vomiting   Penicillins Hives    Immunization History  Administered Date(s) Administered   Tdap 04/02/2018    Past Medical History:  Diagnosis Date   Arm pain    Back pain    Neck pain     Tobacco History: Social History   Tobacco Use  Smoking Status Former   Types: Cigarettes   Quit date: 2021   Years since quitting: 2.7  Smokeless Tobacco Never   Counseling given: Not Answered   Outpatient Encounter Medications as of 09/08/2022  Medication Sig   Buprenorphine HCl-Naloxone HCl 8-2 MG FILM Place 1 Film under the tongue 2 (two) times daily.   fluticasone (FLONASE) 50 MCG/ACT nasal spray Place 1 spray into both nostrils daily.   [EXPIRED] predniSONE (DELTASONE) 20 MG tablet Take 1 tablet (20 mg total) by mouth daily with breakfast for 5 days.   senna (SENOKOT) 8.6 MG TABS tablet Take 1 tablet (8.6 mg total) by mouth daily.   No facility-administered encounter medications on file as of 09/08/2022.     Review of Systems  Review of Systems  Constitutional: Negative.   HENT: Negative.    Cardiovascular: Negative.   Gastrointestinal: Negative.   Musculoskeletal:        Right hand pain   Allergic/Immunologic: Negative.   Neurological: Negative.   Psychiatric/Behavioral: Negative.         Physical Exam  BP 125/84 (BP Location: Right Arm, Patient Position: Sitting, Cuff Size: Large)   Pulse 75   Temp 99.8 F (37.7 C)   Ht 5\' 9"  (1.753 m)   Wt 198 lb 6.4 oz (90 kg)   SpO2 100%   BMI 29.30 kg/m   Wt Readings from Last 5 Encounters:  09/08/22 198 lb 6.4 oz (90 kg)  08/25/22 197 lb 6.4 oz (89.5 kg)  07/28/22 199 lb 12.8 oz (90.6 kg)  07/04/22 204 lb (92.5 kg)  06/21/22 206 lb 6.4 oz (93.6 kg)     Physical Exam Vitals and nursing note reviewed.  Constitutional:      General: He is not in acute distress.    Appearance: He is well-developed.  Cardiovascular:     Rate and Rhythm: Normal rate and regular rhythm.  Pulmonary:     Effort: Pulmonary effort is normal.     Breath sounds: Normal breath sounds.  Musculoskeletal:     Right hand: Tenderness present. Decreased range of motion.  Skin:    General: Skin is warm and dry.  Neurological:     Mental Status: He is alert and oriented to person, place, and time.  Lab Results:  CBC    Component Value Date/Time   WBC 4.9 07/20/2013 1728   RBC 5.16 07/20/2013 1728   HGB 14.3 07/20/2013 1728   HCT 42.5 07/20/2013 1728   PLT 228 07/20/2013 1728   MCV 83 07/20/2013 1728   MCH 27.8 07/20/2013 1728   MCHC 33.7 07/20/2013 1728   RDW 13.3 07/20/2013 1728    BMET    Component Value Date/Time   NA 139 07/20/2013 1728   K 4.3 07/20/2013 1728   CL 104 07/20/2013 1728   CO2 34 (H) 07/20/2013 1728   GLUCOSE 112 (H) 07/20/2013 1728   BUN 13 07/20/2013 1728   CREATININE 0.91 07/20/2013 1728   CALCIUM 9.3 07/20/2013 1728   GFRNONAA >60 07/20/2013 1728   GFRAA >60 07/20/2013 1728     Assessment & Plan:   Hand injury, right, initial encounter - DG Hand Complete Right - predniSONE (DELTASONE) 20 MG tablet; Take 1 tablet (20 mg total) by mouth daily with breakfast for 5 days.  Dispense: 5 tablet;  Refill: 0   Follow up:  Follow up as needed     Ivonne Andrew, NP 09/21/2022

## 2022-09-12 ENCOUNTER — Telehealth: Payer: Self-pay

## 2022-09-12 NOTE — Telephone Encounter (Signed)
Pt called ans asking for his Xray results

## 2022-09-14 ENCOUNTER — Telehealth: Payer: Self-pay | Admitting: *Deleted

## 2022-09-14 NOTE — Telephone Encounter (Signed)
Called patient to give his result to the XR for right hand.  He states despite the results, he continues to have pain shooting up arm. Is there anyway to order CT or MRI.   Pls f/u with patient.

## 2022-09-15 ENCOUNTER — Other Ambulatory Visit: Payer: Self-pay | Admitting: Nurse Practitioner

## 2022-09-15 DIAGNOSIS — S6991XA Unspecified injury of right wrist, hand and finger(s), initial encounter: Secondary | ICD-10-CM

## 2022-09-15 NOTE — Telephone Encounter (Signed)
Sydney please let patient know that I placed a referral for hand specialist. [facility] has a hand specialist if [facility] doesn't. Thanks.

## 2022-09-21 DIAGNOSIS — S6991XA Unspecified injury of right wrist, hand and finger(s), initial encounter: Secondary | ICD-10-CM | POA: Insufficient documentation

## 2022-09-21 NOTE — Assessment & Plan Note (Signed)
-   DG Hand Complete Right - predniSONE (DELTASONE) 20 MG tablet; Take 1 tablet (20 mg total) by mouth daily with breakfast for 5 days.  Dispense: 5 tablet; Refill: 0   Follow up:  Follow up as needed

## 2022-09-23 ENCOUNTER — Ambulatory Visit: Payer: 59 | Admitting: Student

## 2022-09-23 DIAGNOSIS — Z79891 Long term (current) use of opiate analgesic: Secondary | ICD-10-CM | POA: Diagnosis not present

## 2022-09-23 DIAGNOSIS — F1190 Opioid use, unspecified, uncomplicated: Secondary | ICD-10-CM

## 2022-09-23 MED ORDER — BUPRENORPHINE HCL-NALOXONE HCL 8-2 MG SL FILM: 1.0000 | ORAL_FILM | Freq: Two times a day (BID) | SUBLINGUAL | 0 refills | Status: DC

## 2022-09-23 NOTE — Patient Instructions (Signed)
It was good seeing you in the clinic today.  I have refilled your suboxone. Please take senna or miralax as needed for constipation.  We weill have you follow up for this in 2 months.

## 2022-09-25 ENCOUNTER — Encounter: Payer: Self-pay | Admitting: Student

## 2022-09-25 NOTE — Progress Notes (Signed)
Established Patient Office Visit  Subjective   Patient ID: Ray Gonzalez, male    DOB: 1976/02/19  Age: 46 y.o. MRN: [id number]  Chief Complaint  Patient presents with   Follow-up   Medication Refill    Ray Gonzalez is a 46 year old person living with opioid use disorder who presents for follow up.    Patient Active Problem List   Diagnosis Date Noted   Hand injury, right, initial encounter 09/21/2022   Chronic rhinosinusitis 07/29/2022   Opioid use disorder 06/21/2022   Polysubstance use disorder 06/21/2022      ROS: negative as per HPI     Objective:     BP (!) 112/55 (BP Location: Right Arm, Patient Position: Sitting, Cuff Size: Small)   Pulse 80   Temp 98.2 F (36.8 C) (Oral)   Ht 5\' 9"  (1.753 m)   Wt 202 lb 11.2 oz (91.9 kg)   SpO2 98%   BMI 29.93 kg/m  BP Readings from Last 3 Encounters:  09/23/22 (!) 112/55  09/08/22 125/84  08/25/22 134/81      Physical Exam Constitutional:      Appearance: Normal appearance.  HENT:     Mouth/Throat:     Mouth: Mucous membranes are moist.     Pharynx: Oropharynx is clear.  Eyes:     Extraocular Movements: Extraocular movements intact.     Pupils: Pupils are equal, round, and reactive to light.  Cardiovascular:     Rate and Rhythm: Normal rate and regular rhythm.     Heart sounds: No murmur heard. Pulmonary:     Effort: Pulmonary effort is normal.     Breath sounds: Normal breath sounds. No rhonchi or rales.  Abdominal:     General: Abdomen is flat. Bowel sounds are normal. There is no distension.     Palpations: Abdomen is soft.     Tenderness: There is no abdominal tenderness.  Musculoskeletal:     Comments: TTP of the right anatomical snuffbox.  Pain with wrist extension and with radial deviation  Skin:    General: Skin is warm and dry.     Findings: No bruising or lesion.  Neurological:     General: No focal deficit present.     Mental Status: He is alert and oriented to person, place,  and time.  Psychiatric:        Mood and Affect: Mood normal.        Behavior: Behavior normal.     No results found for any visits on 09/23/22.   The ASCVD Risk score (Arnett DK, et al., 2019) failed to calculate for the following reasons:   Cannot find a previous HDL lab   Cannot find a previous total cholesterol lab    Assessment & Plan:   Problem List Items Addressed This Visit       Other   Opioid use disorder    Currently doing well on Suboxone 8-2 mg films twice daily.  No withdrawals, relapses or cravings.  Does have constipation which improves with senna and MiraLAX.  PDMP reviewed and appropriate.  Last Tox assure her on 07/28/2022 appropriate for buprenorphine and metabolites.  Reports he has not been on Suboxone for opioid use disorder for last 8 years.  Although he has recently established with our clinic it appears that he is stable on his current dose and doing well we will extend follow-ups to every 8 weeks.  Suboxone 8-2 mg films twice daily Follow-up in 2 months  Relevant Medications   Buprenorphine HCl-Naloxone HCl 8-2 MG FILM   Other Visit Diagnoses     Long term (current) use of opiate analgesic       Relevant Medications   Buprenorphine HCl-Naloxone HCl 8-2 MG FILM       Return in about 2 months (around 11/23/2022) for oud.    Ray Simmonds, MD

## 2022-09-25 NOTE — Assessment & Plan Note (Signed)
Currently doing well on Suboxone 8-2 mg films twice daily.  No withdrawals, relapses or cravings.  Does have constipation which improves with senna and MiraLAX.  PDMP reviewed and appropriate.  Last Tox assure her on 07/28/2022 appropriate for buprenorphine and metabolites.  Reports he has not been on Suboxone for opioid use disorder for last 8 years.  Although he has recently established with our clinic it appears that he is stable on his current dose and doing well we will extend follow-ups to every 8 weeks.  Suboxone 8-2 mg films twice daily Follow-up in 2 months

## 2022-09-27 ENCOUNTER — Ambulatory Visit (INDEPENDENT_AMBULATORY_CARE_PROVIDER_SITE_OTHER): Payer: 59 | Admitting: Orthopedic Surgery

## 2022-09-27 DIAGNOSIS — M79641 Pain in right hand: Secondary | ICD-10-CM

## 2022-09-27 NOTE — Progress Notes (Signed)
Internal Medicine Clinic Attending  Case discussed with Dr. Lisabeth Devoid  At the time of the visit.  We reviewed the resident's history and exam and pertinent patient test results.  I agree with the assessment, diagnosis, and plan of care documented in the resident's note with the following correction. Mr. Shaheen has been on Suboxone therapy for the last 8 years and stable during this time.

## 2022-10-04 ENCOUNTER — Encounter: Payer: Self-pay | Admitting: Orthopedic Surgery

## 2022-10-04 NOTE — Progress Notes (Signed)
Office Visit Note   Patient: Ray Gonzalez           Date of Birth: 02-01-1976           MRN: [id number] Visit Date: 09/27/2022              Requested by: Ivonne Andrew, NP [id number] [place],  [place] [address] PCP: Ivonne Andrew, NP  Chief Complaint  Patient presents with   Right Hand - Injury    DOI 08/11/22       HPI: Patient is a 46 year old gentleman who was seen for initial evaluation for right hand pain base of the right thumb.  Patient had radiographs obtained on 09/08/2022.  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  Patient had taken prednisone 20 mg a day for 5 days without relief.  Patient states he is on Suboxone.  Assessment & Plan: Visit Diagnoses:  1. Pain in right hand     Plan: Recommended Voltaren gel.  Discussed that he could use a thumb spica brace to provide better support.  Follow-Up Instructions: Return if symptoms worsen or fail to improve.   Ortho Exam  Patient is alert, oriented, no adenopathy, well-dressed, normal affect, normal respiratory effort. Examination patient has no pain to palpation over the first dorsal extensor compartment.  He has good active extension of the thumb ulnar collateral ligament is stable.  Patient's pain is reproduced with palpation at the base of the thumb at the carpometacarpal joint.  Review of the radiographs shows no evidence of a scaphoid fracture he has no open wounds.  Imaging: No results found. No images are attached to the encounter.  Labs: No results found for: "HGBA1C", "ESRSEDRATE", "CRP", "LABURIC", "REPTSTATUS", "GRAMSTAIN", "CULT", "LABORGA"   Lab Results  Component Value Date   ALBUMIN 4.1 07/20/2013    No results found for: "MG" No results found for: "VD25OH"  No results found for: "PREALBUMIN"    Latest Ref Rng & Units 07/20/2013    5:28 PM  CBC EXTENDED  WBC 3.8 - 10.6 x10 3/mm 3 4.9   RBC  4.40 - 5.90 x10 6/mm 3 5.16   Hemoglobin 13.0 - 18.0 g/dL 62.9   HCT 52.8 - 41.3 % 42.5   Platelets 150 - 440 x10 3/mm 3 228      There is no height or weight on file to calculate BMI.  Orders:  No orders of the defined types were placed in this encounter.  No orders of the defined types were placed in this encounter.    Procedures: No procedures performed  Clinical Data: No additional findings.  ROS:  All other systems negative, except as noted in the HPI. Review of Systems  Objective: Vital Signs: There were no vitals taken for this visit.  Specialty Comments:  No specialty comments available.  PMFS History: Patient Active Problem List   Diagnosis Date Noted   Hand injury, right, initial encounter 09/21/2022   Chronic rhinosinusitis 07/29/2022   Opioid use disorder 06/21/2022   Polysubstance use disorder 06/21/2022   Past Medical History:  Diagnosis Date   Arm pain    Back pain    Neck pain     History reviewed. No pertinent family history.  Past Surgical History:  Procedure Laterality Date   HERNIA REPAIR     KNEE SURGERY     left   Social History  Occupational History   Not on file  Tobacco Use   Smoking status: Former    Types: Cigarettes    Quit date: 2021    Years since quitting: 2.7   Smokeless tobacco: Never  Substance and Sexual Activity   Alcohol use: No   Drug use: No   Sexual activity: Not on file

## 2022-10-07 ENCOUNTER — Ambulatory Visit: Payer: Self-pay | Admitting: Nurse Practitioner

## 2022-10-19 ENCOUNTER — Other Ambulatory Visit: Payer: Self-pay

## 2022-10-19 DIAGNOSIS — Z79891 Long term (current) use of opiate analgesic: Secondary | ICD-10-CM

## 2022-10-25 ENCOUNTER — Other Ambulatory Visit: Payer: Self-pay

## 2022-10-25 MED ORDER — BUPRENORPHINE HCL-NALOXONE HCL 8-2 MG SL FILM: 1.0000 | ORAL_FILM | Freq: Two times a day (BID) | SUBLINGUAL | 0 refills | Status: DC

## 2022-10-25 NOTE — Telephone Encounter (Signed)
OPEN ERROR

## 2022-10-25 NOTE — Telephone Encounter (Signed)
PDMP is down right now, unable to verify fill history. But I reviewed the previous notes. Approved 1 month refill. Please check PDMP at follow up.

## 2022-11-21 ENCOUNTER — Ambulatory Visit: Payer: 59 | Admitting: Student

## 2022-11-21 ENCOUNTER — Encounter: Payer: Self-pay | Admitting: Student

## 2022-11-21 VITALS — BP 106/62 | HR 80 | Temp 98.3°F | Resp 28 | Ht 69.0 in | Wt 214.3 lb

## 2022-11-21 DIAGNOSIS — F1190 Opioid use, unspecified, uncomplicated: Secondary | ICD-10-CM

## 2022-11-21 DIAGNOSIS — Z79891 Long term (current) use of opiate analgesic: Secondary | ICD-10-CM

## 2022-11-21 MED ORDER — BUPRENORPHINE HCL-NALOXONE HCL 8-2 MG SL FILM: 1.0000 | ORAL_FILM | Freq: Two times a day (BID) | SUBLINGUAL | 0 refills | Status: DC

## 2022-11-21 NOTE — Patient Instructions (Signed)
It was a pleasure seeing you in clinic today. Continue your suboxone 2 films daily. I have changed you pharmacy so it will be closer to home. Follow up in 8 weeks

## 2022-11-23 NOTE — Progress Notes (Signed)
Internal Medicine Clinic Attending ? ?Case discussed with Dr. Liang  At the time of the visit.  We reviewed the resident?s history and exam and pertinent patient test results.  I agree with the assessment, diagnosis, and plan of care documented in the resident?s note. ? ?

## 2022-11-23 NOTE — Assessment & Plan Note (Signed)
Presents for OUD follow up. Continues to do well on current buprenorphine-naloxone 8-3 mg 2 films daily. Denies relapses, withdrawal, or cravings. Still has a few days of films left. PDMP reviewed and appropriate. Still having some constipation discussed incorporating more fiber in diet in addition to miralax and senna. He will try this. Plan for UDS next visit.   -refilled buprenorphine-naloxone 8-3 mg 2 films daily - follow up in 2 months

## 2022-11-23 NOTE — Progress Notes (Signed)
Established Patient Office Visit  Subjective   Patient ID: Ray Gonzalez, male    DOB: 03-Feb-1976  Age: 46 y.o. MRN: [id number]  Chief Complaint  Patient presents with   Medication Refill    Suboxone     Ray Gonzalez is a 46 y.o. person living with a history listed below who presents to clinic for oud follow up. Please refer to problem based charting for further details and assessment and plan of current problem and chronic medical conditions.    Patient Active Problem List   Diagnosis Date Noted   Hand injury, right, initial encounter 09/21/2022   Chronic rhinosinusitis 07/29/2022   Opioid use disorder 06/21/2022   Polysubstance use disorder 06/21/2022      ROS: negative as per HPI     Objective:     BP 106/62 (BP Location: Right Arm, Patient Position: Sitting, Cuff Size: Large)   Pulse 80   Temp 98.3 F (36.8 C) (Oral)   Resp (!) 28   Ht 5\' 9"  (1.753 m)   Wt 214 lb 4.8 oz (97.2 kg)   SpO2 97% Comment: room air  BMI 31.65 kg/m  BP Readings from Last 3 Encounters:  11/21/22 106/62  09/23/22 (!) 112/55  09/08/22 125/84      Physical Exam Constitutional:      Appearance: Normal appearance.  HENT:     Mouth/Throat:     Mouth: Mucous membranes are moist.     Pharynx: Oropharynx is clear.  Cardiovascular:     Rate and Rhythm: Normal rate and regular rhythm.  Pulmonary:     Effort: Pulmonary effort is normal.     Breath sounds: No rhonchi or rales.  Abdominal:     General: Abdomen is flat. Bowel sounds are normal. There is no distension.     Palpations: Abdomen is soft.     Tenderness: There is no abdominal tenderness.  Musculoskeletal:        General: Normal range of motion.     Right lower leg: No edema.     Left lower leg: No edema.  Skin:    General: Skin is warm and dry.     Capillary Refill: Capillary refill takes less than 2 seconds.  Neurological:     General: No focal deficit present.     Mental Status: He is alert and  oriented to person, place, and time. Mental status is at baseline.  Psychiatric:        Mood and Affect: Mood normal.        Behavior: Behavior normal.      No results found for any visits on 11/21/22.     The ASCVD Risk score (Arnett DK, et al., 2019) failed to calculate for the following reasons:   Cannot find a previous HDL lab   Cannot find a previous total cholesterol lab    Assessment & Plan:   Problem List Items Addressed This Visit       Other   Opioid use disorder - Primary    Presents for OUD follow up. Continues to do well on current buprenorphine-naloxone 8-3 mg 2 films daily. Denies relapses, withdrawal, or cravings. Still has a few days of films left. PDMP reviewed and appropriate. Still having some constipation discussed incorporating more fiber in diet in addition to miralax and senna. He will try this. Plan for UDS next visit.   -refilled buprenorphine-naloxone 8-3 mg 2 films daily - follow up in 2 months  Relevant Medications   Buprenorphine HCl-Naloxone HCl 8-2 MG FILM   Other Visit Diagnoses     Long term (current) use of opiate analgesic       Relevant Medications   Buprenorphine HCl-Naloxone HCl 8-2 MG FILM       Return in about 8 weeks (around 01/16/2023).    Quincy Simmonds, MD

## 2022-12-22 ENCOUNTER — Other Ambulatory Visit: Payer: Self-pay | Admitting: Student

## 2022-12-22 ENCOUNTER — Telehealth: Payer: Self-pay

## 2022-12-22 ENCOUNTER — Telehealth: Payer: Self-pay | Admitting: *Deleted

## 2022-12-22 MED ORDER — BUPRENORPHINE HCL-NALOXONE HCL 8-2 MG SL FILM: 1.0000 | ORAL_FILM | Freq: Two times a day (BID) | SUBLINGUAL | 0 refills | Status: DC

## 2022-12-22 NOTE — Telephone Encounter (Signed)
Suboxone has been refilled by Dr. Mikey Bussing. Thanks.

## 2022-12-22 NOTE — Telephone Encounter (Signed)
Requesting refill on Suboxone @ [id number] [place], [place] - [address]

## 2022-12-22 NOTE — Telephone Encounter (Signed)
Refill sent, due for follow up appointment at end of January

## 2023-01-23 ENCOUNTER — Encounter: Payer: 59 | Admitting: Student

## 2023-01-25 ENCOUNTER — Ambulatory Visit: Payer: Managed Care, Other (non HMO) | Admitting: Student

## 2023-01-25 VITALS — BP 118/79 | HR 83 | Temp 98.3°F | Wt 212.2 lb

## 2023-01-25 DIAGNOSIS — F1121 Opioid dependence, in remission: Secondary | ICD-10-CM

## 2023-01-25 DIAGNOSIS — E669 Obesity, unspecified: Secondary | ICD-10-CM

## 2023-01-25 DIAGNOSIS — Z114 Encounter for screening for human immunodeficiency virus [HIV]: Secondary | ICD-10-CM

## 2023-01-25 DIAGNOSIS — Z1159 Encounter for screening for other viral diseases: Secondary | ICD-10-CM

## 2023-01-25 DIAGNOSIS — Z0000 Encounter for general adult medical examination without abnormal findings: Secondary | ICD-10-CM

## 2023-01-25 DIAGNOSIS — F1190 Opioid use, unspecified, uncomplicated: Secondary | ICD-10-CM

## 2023-01-25 MED ORDER — BUPRENORPHINE HCL-NALOXONE HCL 8-2 MG SL FILM: 1.0000 | ORAL_FILM | Freq: Two times a day (BID) | SUBLINGUAL | 0 refills | Status: DC

## 2023-01-25 NOTE — Patient Instructions (Signed)
We will see you in two months.

## 2023-01-26 DIAGNOSIS — Z0000 Encounter for general adult medical examination without abnormal findings: Secondary | ICD-10-CM | POA: Insufficient documentation

## 2023-01-26 DIAGNOSIS — E669 Obesity, unspecified: Secondary | ICD-10-CM | POA: Insufficient documentation

## 2023-01-26 LAB — HEMOGLOBIN A1C
Est. average glucose Bld gHb Est-mCnc: 105 mg/dL
Hgb A1c MFr Bld: 5.3 % (ref 4.8–5.6)

## 2023-01-26 LAB — HIV ANTIBODY (ROUTINE TESTING W REFLEX): HIV Screen 4th Generation wRfx: NONREACTIVE

## 2023-01-26 NOTE — Assessment & Plan Note (Signed)
Obtained a A1c

## 2023-01-26 NOTE — Progress Notes (Signed)
   CC: F/u OUD and general check up   HPI:  [name] is a 47 y.o. M with PMH per below. Please see problem based charting under encounters tab for further details.    Past Medical History:  Diagnosis Date   Arm pain    Back pain    Neck pain    Review of Systems:  Please see problem based charting under encounters tab for further details.    Physical Exam:  Vitals:   01/25/23 1539  BP: 118/79  Pulse: 83  Temp: 98.3 F (36.8 C)  TempSrc: Oral  SpO2: 97%  Weight: 212 lb 3.2 oz (96.3 kg)    Constitutional: Well-developed, well-nourished, and in no distress.   Cardiovascular: Normal rate, regular rhythm, intact distal pulses. No gallop and no friction rub.  No murmur heard. No lower extremity edema  Pulmonary: Non labored breathing on room air, no wheezing or rales  Abdominal: Soft. Normal bowel sounds. Non distended and non tender Musculoskeletal: Normal range of motion.        General: No tenderness or edema.  Neurological: Alert and oriented to person, place, and time. Non focal  Skin: Skin is warm and dry.    Assessment & Plan:   See Encounters Tab for problem based charting.  Patient discussed with Dr. Heber [place]

## 2023-01-26 NOTE — Assessment & Plan Note (Signed)
Checked HIV and Hep C

## 2023-01-26 NOTE — Assessment & Plan Note (Signed)
Well controlled on suboxone 8-2mg  BID. He has no cravings, has not used in recent opioids. He does note some constipation, but will increase his fruit intake for this. His last tox assure 07/2022 was appropriate.   -Refill suboxone, if toxassure appropriate will give patient 2 refills and plan to see him in 3 months.

## 2023-01-30 LAB — SPECIMEN STATUS REPORT

## 2023-01-30 LAB — HEPATITIS C ANTIBODY: Hep C Virus Ab: NONREACTIVE

## 2023-01-30 NOTE — Progress Notes (Signed)
Internal Medicine Clinic Attending  Case discussed with the resident at the time of the visit.  We reviewed the resident's history and exam and pertinent patient test results.  I agree with the assessment, diagnosis, and plan of care documented in the resident's note.  

## 2023-02-01 LAB — TOXASSURE SELECT,+ANTIDEPR,UR

## 2023-02-22 ENCOUNTER — Other Ambulatory Visit: Payer: Self-pay

## 2023-02-22 DIAGNOSIS — F1190 Opioid use, unspecified, uncomplicated: Secondary | ICD-10-CM

## 2023-02-22 MED ORDER — BUPRENORPHINE HCL-NALOXONE HCL 8-2 MG SL FILM: 1.0000 | ORAL_FILM | Freq: Two times a day (BID) | SUBLINGUAL | 0 refills | Status: DC

## 2023-03-26 ENCOUNTER — Other Ambulatory Visit: Payer: Self-pay | Admitting: Student

## 2023-03-26 ENCOUNTER — Telehealth: Payer: Self-pay | Admitting: Student

## 2023-03-26 DIAGNOSIS — F1190 Opioid use, unspecified, uncomplicated: Secondary | ICD-10-CM

## 2023-03-26 MED ORDER — BUPRENORPHINE HCL-NALOXONE HCL 8-2 MG SL FILM: 1.0000 | ORAL_FILM | Freq: Two times a day (BID) | SUBLINGUAL | 0 refills | Status: DC

## 2023-03-26 MED ORDER — BUPRENORPHINE HCL-NALOXONE HCL 8-2 MG SL FILM: 1.0000 | ORAL_FILM | Freq: Two times a day (BID) | SUBLINGUAL | 2 refills | Status: DC

## 2023-03-26 NOTE — Telephone Encounter (Signed)
Refilled suboxone 2 films a day (8-2mg ).

## 2023-04-25 ENCOUNTER — Ambulatory Visit: Payer: Managed Care, Other (non HMO) | Admitting: Student

## 2023-04-25 ENCOUNTER — Encounter: Payer: Self-pay | Admitting: Student

## 2023-04-25 VITALS — BP 110/70 | HR 80 | Temp 97.9°F | Ht 69.0 in | Wt 217.1 lb

## 2023-04-25 DIAGNOSIS — E669 Obesity, unspecified: Secondary | ICD-10-CM

## 2023-04-25 DIAGNOSIS — Z79891 Long term (current) use of opiate analgesic: Secondary | ICD-10-CM

## 2023-04-25 DIAGNOSIS — Z6830 Body mass index (BMI) 30.0-30.9, adult: Secondary | ICD-10-CM

## 2023-04-25 DIAGNOSIS — F1121 Opioid dependence, in remission: Secondary | ICD-10-CM | POA: Diagnosis not present

## 2023-04-25 DIAGNOSIS — M545 Low back pain, unspecified: Secondary | ICD-10-CM

## 2023-04-25 DIAGNOSIS — M549 Dorsalgia, unspecified: Secondary | ICD-10-CM | POA: Insufficient documentation

## 2023-04-25 DIAGNOSIS — F1190 Opioid use, unspecified, uncomplicated: Secondary | ICD-10-CM

## 2023-04-25 MED ORDER — BUPRENORPHINE HCL-NALOXONE HCL 8-2 MG SL FILM: 1.0000 | ORAL_FILM | Freq: Two times a day (BID) | SUBLINGUAL | 1 refills | Status: DC

## 2023-04-25 MED ORDER — SENNA 8.6 MG PO TABS: 1.0000 | ORAL_TABLET | Freq: Every day | ORAL | 0 refills | Status: DC

## 2023-04-25 MED ORDER — FLUTICASONE PROPIONATE 50 MCG/ACT NA SUSP: 1.0000 | Freq: Every day | NASAL | 1 refills | Status: DC

## 2023-04-25 MED ORDER — SENNA 8.6 MG PO TABS: 1.0000 | ORAL_TABLET | Freq: Every day | ORAL | 2 refills | Status: DC

## 2023-04-25 MED ORDER — METHOCARBAMOL 500 MG PO TABS: ORAL_TABLET | ORAL | 0 refills | Status: DC

## 2023-04-25 NOTE — Progress Notes (Signed)
CC: OUD follow-up  HPI:  [name] is a 47 y.o. male with PMH as below who presents to clinic for 57-month follow-up on his opioid use disorder. Please see problem based charting for evaluation, assessment and plan.  Past Medical History:  Diagnosis Date   Arm pain    Back pain    Neck pain     Review of Systems:  Constitutional: Positive for trouble sleeping Eyes: Negative for visual changes Respiratory: Negative for shortness of breath Cardiac: Negative for chest pain MSK: Positive for back pain Abdomen: Negative for abdominal pain, constipation or diarrhea Neuro: Negative for headache or weakness  Physical Exam: General: Pleasant, well-appearing young man.  No acute distress. Cardiac: RRR. No murmurs, rubs or gallops. No LE edema Respiratory: Lungs CTAB. No wheezing or crackles. Abdominal: Soft, symmetric and non tender. Normal BS. Skin: Warm, dry and intact without rashes or lesions MSK: Mild tenderness to palpation of the right paralumbar muscles. No tenderness to the T-spine, L-spine or C-spine.  Negative straight leg test.  Normal ROM. Neuro: A&O x 3. Moves all extremities. Normal sensation to light touch. Psych: Appropriate mood and affect.  Vitals:   04/25/23 1540  BP: 110/70  Pulse: 80  Temp: 97.9 F (36.6 C)  TempSrc: Oral  SpO2: 95%  Weight: 217 lb 1.6 oz (98.5 kg)  Height: 5\' 9"  (1.753 m)    Assessment & Plan:   Opioid use disorder, moderate, in sustained remission, on maintenance therapy [facility]) Patient here for his 3 months follow-up.  States he has been doing okay on the Suboxone 8-2 mg twice daily.  He denies any recent cravings but reports he has had trouble dating again after multiple girlfriend cheated on him.  He is not interested in seeing a therapist and he is " old school" so prefers to deal with problems on his own.  His urine drug screens have been appropriate.  PDMP reviewed and is appropriate with last fill on 3/31 for 30-day  supply.  Plan: -Refilled Suboxone 8-2 mg, 1 film twice daily -Refill Senokot -Follow-up repeat tox screen -Follow-up in 3 months, will need 2 refills before next visit.  Back pain Patient reports that over the last 3 to 4 weeks, he has noticed worsening back pain in his right lower back.  He tells me that he has put on some weight over the last few months and has had occasional back pain with movement described as soreness and stiff.  States when his back gets tight, he has difficulty moving until it goes away.  States he has been limiting his function and work.  The pain has been worse at night making it difficult to sleep. He denies any trauma, numbness, bowel or urinary incontinence, tingling or weakness. On exam, he has some mild tenderness to palpation of the right paralumbar muscles but no spinal tenderness.  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  I discussed plan for conservative management with back stretches, heat and a short course of muscle relaxers.  Plan: -Start Robaxin 500 mg at bedtime as needed for muscle spasm -Apply heat to low back -Provided information on back stretches to do at home  Obesity (BMI 30-39.9) He tells me that he is getting some weight over the last few months and has not been able to drop the weight.  I counseled him on decreasing his caloric intake/eating less carb-heavy foods and exercising daily to burn calories so he has a caloric  deficit at the end of the day.  States he will work on this. Body mass index is 32.06 kg/m. -Follow-up in 3 months    See Encounters Tab for problem based charting.  Patient discussed with Dr.  Curt Bears, MD, MPH

## 2023-04-25 NOTE — Assessment & Plan Note (Signed)
He tells me that he is getting some weight over the last few months and has not been able to drop the weight.  I counseled him on decreasing his caloric intake/eating less carb-heavy foods and exercising daily to burn calories so he has a caloric deficit at the end of the day.  States he will work on this. Body mass index is 32.06 kg/m. -Follow-up in 3 months

## 2023-04-25 NOTE — Assessment & Plan Note (Addendum)
Patient here for his 3 months follow-up.  States he has been doing okay on the Suboxone 8-2 mg twice daily.  He denies any recent cravings but reports he has had trouble dating again after multiple girlfriend cheated on him.  He is not interested in seeing a therapist and he is " old school" so prefers to deal with problems on his own.  His urine drug screens have been appropriate.  PDMP reviewed and is appropriate with last fill on 3/31 for 30-day supply.  Plan: -Refilled Suboxone 8-2 mg, 1 film twice daily -Refill Senokot -Follow-up repeat tox screen -Follow-up in 3 months, will need 2 refills before next visit.

## 2023-04-25 NOTE — Patient Instructions (Signed)
Thank you, [name] for allowing [place] to provide your care today. Today we discussed your Suboxone therapy and recent back pain.  I want you to do some back stretches at home to help alleviate your back spasm.  I am also sending you a short course of muscle relaxer to take at night.  I have ordered the following medication/changed the following medications:  Start Robaxin 500 mg daily at bedtime as needed for back spasm  My Chart Access: [email or web address]  Please follow-up in 3 months  Please make sure to arrive 15 minutes prior to your next appointment. If you arrive late, you may be asked to reschedule.    We look forward to seeing you next time. Please call our clinic at [phone] if you have any questions or concerns. The best time to call is Monday-Friday from 9am-4pm, but there is someone available 24/7. If after hours or the weekend, call the main hospital number and ask for the Internal Medicine Resident On-Call. If you need medication refills, please notify your pharmacy one week in advance and they will send [place] a request.   Thank you for letting us take part in your care. Wishing you the best!  Steffanie Rainwater, MD 04/25/2023, 4:35 PM IM Resident, PGY-3 [name] 41:10

## 2023-04-25 NOTE — Assessment & Plan Note (Signed)
Patient reports that over the last 3 to 4 weeks, he has noticed worsening back pain in his right lower back.  He tells me that he has put on some weight over the last few months and has had occasional back pain with movement described as soreness and stiff.  States when his back gets tight, he has difficulty moving until it goes away.  States he has been limiting his function and work.  The pain has been worse at night making it difficult to sleep. He denies any trauma, numbness, bowel or urinary incontinence, tingling or weakness. On exam, he has some mild tenderness to palpation of the right paralumbar muscles but no spinal tenderness.  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  I discussed plan for conservative management with back stretches, heat and a short course of muscle relaxers.  Plan: -Start Robaxin 500 mg at bedtime as needed for muscle spasm -Apply heat to low back -Provided information on back stretches to do at home

## 2023-04-29 LAB — TOXASSURE SELECT,+ANTIDEPR,UR

## 2023-05-03 NOTE — Progress Notes (Signed)
Internal Medicine Clinic Attending  Case discussed with Dr. Amponsah  At the time of the visit.  We reviewed the resident's history and exam and pertinent patient test results.  I agree with the assessment, diagnosis, and plan of care documented in the resident's note.  

## 2023-05-23 ENCOUNTER — Other Ambulatory Visit: Payer: Self-pay

## 2023-05-23 DIAGNOSIS — F1190 Opioid use, unspecified, uncomplicated: Secondary | ICD-10-CM

## 2023-06-16 ENCOUNTER — Other Ambulatory Visit: Payer: Self-pay | Admitting: Student

## 2023-06-17 ENCOUNTER — Other Ambulatory Visit: Payer: Self-pay | Admitting: Student

## 2023-06-17 DIAGNOSIS — F1190 Opioid use, unspecified, uncomplicated: Secondary | ICD-10-CM

## 2023-06-21 NOTE — Telephone Encounter (Signed)
Patient requesting rx for suboxone, patient will be out Saturday.

## 2023-06-22 MED ORDER — BUPRENORPHINE HCL-NALOXONE HCL 8-2 MG SL FILM: 1.0000 | ORAL_FILM | Freq: Two times a day (BID) | SUBLINGUAL | 1 refills | Status: DC

## 2023-07-19 ENCOUNTER — Other Ambulatory Visit: Payer: Self-pay

## 2023-07-19 DIAGNOSIS — F1190 Opioid use, unspecified, uncomplicated: Secondary | ICD-10-CM

## 2023-07-19 MED ORDER — BUPRENORPHINE HCL-NALOXONE HCL 8-2 MG SL FILM: 1.0000 | ORAL_FILM | Freq: Two times a day (BID) | SUBLINGUAL | 1 refills | Status: DC

## 2023-07-19 NOTE — Telephone Encounter (Signed)
LOV 04-25-23

## 2023-08-07 ENCOUNTER — Encounter: Payer: Self-pay | Admitting: Internal Medicine

## 2023-08-07 ENCOUNTER — Other Ambulatory Visit: Payer: Self-pay

## 2023-08-07 ENCOUNTER — Ambulatory Visit: Payer: Managed Care, Other (non HMO) | Admitting: Internal Medicine

## 2023-08-07 VITALS — BP 128/75 | HR 78 | Temp 98.0°F | Ht 68.0 in | Wt 213.0 lb

## 2023-08-07 DIAGNOSIS — F1121 Opioid dependence, in remission: Secondary | ICD-10-CM | POA: Diagnosis not present

## 2023-08-07 DIAGNOSIS — F1190 Opioid use, unspecified, uncomplicated: Secondary | ICD-10-CM

## 2023-08-07 DIAGNOSIS — Z79899 Other long term (current) drug therapy: Secondary | ICD-10-CM

## 2023-08-07 MED ORDER — BUPRENORPHINE HCL-NALOXONE HCL 8-2 MG SL FILM: 1.0000 | ORAL_FILM | Freq: Two times a day (BID) | SUBLINGUAL | 2 refills | Status: DC

## 2023-08-07 NOTE — Assessment & Plan Note (Signed)
On suboxone 8-2 mg BID. He is doing well on this dose without concern for cravings, withdrawal, or relapses. He is having regular bowel movements without the need for senna. He follows a 3 month follow up schedule. PDMP reviewed and appropriate. ToxAssure 03/2023 with expected results.  He does raise questions about a lawsuit against Suboxone due to increased risk for tooth decay. He was contacted by a lawyer about this and has been conversing with them.   We discussed the possibility of tapering suboxone and ultimately coming off of the medication. He wishes to discuss this further at his next visit.  Plan:Refill suboxone 8-2 mg BID, senna 8.6 mg daily. ToxAssure today. Follow up in 3 months.

## 2023-08-07 NOTE — Patient Instructions (Signed)
Ray Gonzalez,  I will send a refill of your suboxone today. Please plan for follow up in 3 months, or sooner if needed.  As we discussed, I do recommend you follow up with a dentist.  My best, Dr. August Saucer

## 2023-08-07 NOTE — Addendum Note (Signed)
Addended by: Ihor Dow on: 08/07/2023 04:49 PM   Modules accepted: Orders

## 2023-08-07 NOTE — Progress Notes (Signed)
   CC: OUD f/u  HPI:  [name] is a 47 y.o. male with past medical history as detailed below who presents today for OUD f/u. Please see problem based charting for detailed assessment and plan.  Past Medical History:  Diagnosis Date   Arm pain    Back pain    Neck pain    Review of Systems:  Negative unless otherwise stated.  Physical Exam:  Vitals:   08/07/23 1520  BP: 128/75  Pulse: 78  Temp: 98 F (36.7 C)  TempSrc: Oral  SpO2: 97%  Weight: 213 lb (96.6 kg)  Height: 5\' 8"  (1.727 m)   Constitutional:Well appearing gentleman in no acute distress. Cardio:Regular rate and rhythm. No murmurs, rubs, or gallops. Pulm:Clear to auscultation bilaterally. Normal work of breathing on room air. Skin:Warm and dry. Neuro:Alert and oriented x3. No focal deficit noted. Psych:Pleasant mood and affect.  Assessment & Plan:   See Encounters Tab for problem based charting.  Opioid use disorder, moderate, in sustained remission, on maintenance therapy (HCC) On suboxone 8-2 mg BID. He is doing well on this dose without concern for cravings, withdrawal, or relapses. He is having regular bowel movements without the need for senna. He follows a 3 month follow up schedule. PDMP reviewed and appropriate. ToxAssure 03/2023 with expected results.  He does raise questions about a lawsuit against Suboxone due to increased risk for tooth decay. He was contacted by a lawyer about this and has been conversing with them.   We discussed the possibility of tapering suboxone and ultimately coming off of the medication. He wishes to discuss this further at his next visit.  Plan:Refill suboxone 8-2 mg BID, senna 8.6 mg daily. ToxAssure today. Follow up in 3 months.  Patient discussed with Dr. Mikey Bussing

## 2023-08-10 NOTE — Progress Notes (Signed)
Internal Medicine Clinic Attending  Case discussed with the resident at the time of the visit.  We reviewed the resident's history and exam and pertinent patient test results.  I agree with the assessment, diagnosis, and plan of care documented in the resident's note.  

## 2023-09-18 ENCOUNTER — Other Ambulatory Visit: Payer: Self-pay

## 2023-09-18 DIAGNOSIS — F1190 Opioid use, unspecified, uncomplicated: Secondary | ICD-10-CM

## 2023-09-21 NOTE — Telephone Encounter (Signed)
Please advise rx refill

## 2023-10-16 ENCOUNTER — Other Ambulatory Visit: Payer: Self-pay

## 2023-10-16 DIAGNOSIS — F1190 Opioid use, unspecified, uncomplicated: Secondary | ICD-10-CM

## 2023-10-17 MED ORDER — BUPRENORPHINE HCL-NALOXONE HCL 8-2 MG SL FILM: 1.0000 | ORAL_FILM | Freq: Two times a day (BID) | SUBLINGUAL | 0 refills | Status: DC

## 2023-10-17 NOTE — Telephone Encounter (Signed)
Last rx written 08/18/23. Last OV 08/16/23. Next OV 11/06/23. TOX 08/07/23.

## 2023-10-18 ENCOUNTER — Telehealth: Payer: Self-pay | Admitting: *Deleted

## 2023-10-18 NOTE — Telephone Encounter (Signed)
Call from patient was told by Pharmacy that he will be unable to pick up his Suboxone until tomorrow.  Call to Pharmacy patient last picked up his Suboxone on 09/19/2023.  Pharmacy said that patient will be able to pick up the Suboxone on tomorrow at 30 days.  Asked patient about his dose for today. Says he will run out on today.  Plans to go out of town.  Stated I guess I will not be able to go out of town and plans to change his Pharmacy.

## 2023-10-19 ENCOUNTER — Other Ambulatory Visit: Payer: Self-pay

## 2023-10-19 DIAGNOSIS — F1190 Opioid use, unspecified, uncomplicated: Secondary | ICD-10-CM

## 2023-10-19 MED ORDER — BUPRENORPHINE HCL-NALOXONE HCL 8-2 MG SL FILM: 1.0000 | ORAL_FILM | Freq: Two times a day (BID) | SUBLINGUAL | 0 refills | Status: DC

## 2023-10-19 NOTE — Telephone Encounter (Signed)
Call from pt - stated [facility] has [facility] in [facility]. Stated he told them it has been 30 days and needs his refill  (he's going on vacation). Stated he wants new rx sent to [facility] in [place].

## 2023-10-19 NOTE — Telephone Encounter (Signed)
[facility] pt went to the drive through at 9am this morning to pick-up his medication for Buprenorphine HCl-Naloxone HCl (SUBOXONE) 8-2 MG FILM. Pharmacy tech told the patient that he would have to wait while they're getting the medication ready for him. Pt got upset and curse out the pharmacy tech. [facility] states they will not filled this medication for him and that we need to send his medication to other pharmacy.

## 2023-11-06 ENCOUNTER — Ambulatory Visit: Payer: Managed Care, Other (non HMO) | Admitting: Internal Medicine

## 2023-11-06 ENCOUNTER — Encounter: Payer: Self-pay | Admitting: Internal Medicine

## 2023-11-06 VITALS — BP 116/67 | HR 81 | Ht 68.0 in | Wt 218.2 lb

## 2023-11-06 DIAGNOSIS — J329 Chronic sinusitis, unspecified: Secondary | ICD-10-CM

## 2023-11-06 DIAGNOSIS — Z79899 Other long term (current) drug therapy: Secondary | ICD-10-CM

## 2023-11-06 DIAGNOSIS — Z0000 Encounter for general adult medical examination without abnormal findings: Secondary | ICD-10-CM

## 2023-11-06 DIAGNOSIS — E669 Obesity, unspecified: Secondary | ICD-10-CM | POA: Diagnosis not present

## 2023-11-06 DIAGNOSIS — F1121 Opioid dependence, in remission: Secondary | ICD-10-CM

## 2023-11-06 DIAGNOSIS — Z6833 Body mass index (BMI) 33.0-33.9, adult: Secondary | ICD-10-CM | POA: Diagnosis not present

## 2023-11-06 DIAGNOSIS — F1190 Opioid use, unspecified, uncomplicated: Secondary | ICD-10-CM

## 2023-11-06 MED ORDER — BUPRENORPHINE HCL-NALOXONE HCL 8-2 MG SL FILM
1.0000 | ORAL_FILM | Freq: Two times a day (BID) | SUBLINGUAL | 0 refills | Status: DC
Start: 2023-11-18 — End: 2024-02-07

## 2023-11-06 MED ORDER — BUPRENORPHINE HCL-NALOXONE HCL 8-2 MG SL FILM
1.0000 | ORAL_FILM | Freq: Two times a day (BID) | SUBLINGUAL | 0 refills | Status: DC
Start: 2023-12-17 — End: 2024-02-07

## 2023-11-06 MED ORDER — BUPRENORPHINE HCL-NALOXONE HCL 8-2 MG SL FILM
1.0000 | ORAL_FILM | Freq: Two times a day (BID) | SUBLINGUAL | 0 refills | Status: DC
Start: 2024-01-16 — End: 2024-01-12

## 2023-11-06 MED ORDER — FLUTICASONE PROPIONATE 50 MCG/ACT NA SUSP: 1.0000 | Freq: Every day | NASAL | 3 refills | Status: DC

## 2023-11-06 NOTE — Progress Notes (Signed)
Subjective:  CC: OUD  HPI:  [name] is a 47 y.o. male with a past medical history stated below and presents today for OUD.   Please see problem based assessment and plan for additional details.  Past Medical History:  Diagnosis Date   Arm pain    Back pain    Neck pain     Current Outpatient Medications on File Prior to Visit  Medication Sig Dispense Refill   senna (SENOKOT) 8.6 MG TABS tablet Take 1 tablet (8.6 mg total) by mouth daily. 30 tablet 2   No current facility-administered medications on file prior to visit.    No family history on file.  Social History   Socioeconomic History   Marital status: Married    Spouse name: Not on file   Number of children: Not on file   Years of education: Not on file   Highest education level: Not on file  Occupational History   Not on file  Tobacco Use   Smoking status: Former    Current packs/day: 0.00    Types: Cigarettes    Quit date: 2021    Years since quitting: 3.8   Smokeless tobacco: Never  Substance and Sexual Activity   Alcohol use: No   Drug use: No   Sexual activity: Not on file  Other Topics Concern   Not on file  Social History Narrative   Not on file   Social Determinants of Health   Financial Resource Strain: Low Risk  (11/21/2022)   Overall Financial Resource Strain (CARDIA)    Difficulty of Paying Living Expenses: Not hard at all  Food Insecurity: No Food Insecurity (11/21/2022)   Hunger Vital Sign    Worried About Running Out of Food in the Last Year: Never true    Ran Out of Food in the Last Year: Never true  Transportation Needs: No Transportation Needs (11/21/2022)   PRAPARE - Administrator, Civil Service (Medical): No    Lack of Transportation (Non-Medical): No  Physical Activity: Not on file  Stress: Not on file  Social Connections: Not on file  Intimate Partner Violence: Not on file    Review of Systems: ROS negative except for what is noted on  the assessment and plan.  Objective:   Vitals:   11/06/23 1510  BP: 116/67  Pulse: 81  SpO2: 97%  Weight: 218 lb 3.2 oz (99 kg)  Height: 5\' 8"  (1.727 m)    Physical Exam: Constitutional: well-appearing  Cardiovascular: regular rate and rhythm, no m/r/g Pulmonary/Chest: normal work of breathing on room air, lungs clear to auscultation bilaterally MSK: normal bulk and tone  Assessment & Plan:  Opioid use disorder, moderate, in sustained remission, on maintenance therapy (HCC) On stable dosing of suboxone 8-2 mg BID. His cravings are well controlled on current dosing. Last use of heroin was 7-8 years ago. Toxassures have been appropriate for the last several years. He does have some constipation but takes miralax prn for this.  P: PDMP appropriate, last fill 10/24 [id number] [facility], [place] - [address] [place], [place] [address]  Follow-up 2/24 Plan to repeat toxassure in 6 months (5/24)  Obesity (BMI 30-39.9) Screening labs completed with lipids with BMI 33 P: Lipid panel  Healthcare maintenance -CBC -BMP    Patient discussed with Dr. Wille Celeste Amayiah Gosnell, D.O. [facility] Internal [facility]  PGY-3 Pager: [phone]  Phone:  [phone] Date 11/06/2023  Time 4:56 PM

## 2023-11-06 NOTE — Assessment & Plan Note (Signed)
Screening labs completed with lipids with BMI 33 P: Lipid panel

## 2023-11-06 NOTE — Assessment & Plan Note (Addendum)
On stable dosing of suboxone 8-2 mg BID. His cravings are well controlled on current dosing. Last use of heroin was 7-8 years ago. Toxassures have been appropriate for the last several years. He does have some constipation but takes miralax prn for this.  P: PDMP appropriate, last fill 10/24 [facility]  [id number], [place] - [address] [place], [place] [address]  Follow-up 2/24 Plan to repeat toxassure in 6 months (5/24)

## 2023-11-06 NOTE — Patient Instructions (Addendum)
Thank you, [name] for allowing [place] to provide your care today.   Please make follow up for toxassure in 3 months, mid February.  I am checking cholesterol, blood count and kidney labs today.  I have ordered the following labs for you:  Lab Orders         ToxAssure Select,+Antidepr,UR         CBC no Diff         BMP8+Anion Gap         Lipid Profile       I have ordered the following medication/changed the following medications:   Stop the following medications: Medications Discontinued During This Encounter  Medication Reason   Buprenorphine HCl-Naloxone HCl (SUBOXONE) 8-2 MG FILM Reorder     Start the following medications: Meds ordered this encounter  Medications   Buprenorphine HCl-Naloxone HCl (SUBOXONE) 8-2 MG FILM    Sig: Place 1 Film under the tongue 2 (two) times daily.    Dispense:  60 each    Refill:  0    Rx 1/3. Refill date 11/23   Buprenorphine HCl-Naloxone HCl (SUBOXONE) 8-2 MG FILM    Sig: Place 1 Film under the tongue 2 (two) times daily.    Dispense:  60 each    Refill:  0    Rx 2/3, refill date 12/22   Buprenorphine HCl-Naloxone HCl (SUBOXONE) 8-2 MG FILM    Sig: Place 1 Film under the tongue 2 (two) times daily.    Dispense:  60 each    Refill:  0    Rx 3/3, refill date 1/21     Follow up: 3 months around Feb 21  We look forward to seeing you next time. Please call our clinic at [phone] if you have any questions or concerns. The best time to call is Monday-Friday from 9am-4pm, but there is someone available 24/7. If after hours or the weekend, call the main hospital number and ask for the Internal Medicine Resident On-Call. If you need medication refills, please notify your pharmacy one week in advance and they will send [place] a request.   Thank you for trusting me with your care. Wishing you the best!   Rudene Christians, DO [facility]

## 2023-11-06 NOTE — Assessment & Plan Note (Signed)
-

## 2023-11-07 LAB — BMP8+ANION GAP
Anion Gap: 16 mmol/L (ref 10.0–18.0)
BUN/Creatinine Ratio: 15 (ref 9–20)
BUN: 19 mg/dL (ref 6–24)
CO2: 24 mmol/L (ref 20–29)
Calcium: 9.5 mg/dL (ref 8.7–10.2)
Chloride: 100 mmol/L (ref 96–106)
Creatinine, Ser: 1.24 mg/dL (ref 0.76–1.27)
Glucose: 99 mg/dL (ref 70–99)
Potassium: 4.1 mmol/L (ref 3.5–5.2)
Sodium: 140 mmol/L (ref 134–144)
eGFR: 74 mL/min/{1.73_m2} (ref >59)

## 2023-11-07 LAB — CBC
Hematocrit: 43.1 % (ref 37.5–51.0)
Hemoglobin: 14 g/dL (ref 13.0–17.7)
MCH: 28.2 pg (ref 26.6–33.0)
MCHC: 32.5 g/dL (ref 31.5–35.7)
MCV: 87 fL (ref 79–97)
Platelets: 232 10*3/uL (ref 150–450)
RBC: 4.96 x10E6/uL (ref 4.14–5.80)
RDW: 12.9 % (ref 11.6–15.4)
WBC: 4.8 10*3/uL (ref 3.4–10.8)

## 2023-11-07 LAB — LIPID PANEL
Chol/HDL Ratio: 5.8 ratio — ABNORMAL HIGH (ref 0.0–5.0)
Cholesterol, Total: 216 mg/dL — ABNORMAL HIGH (ref 100–199)
HDL: 37 mg/dL — ABNORMAL LOW (ref >39)
LDL Chol Calc (NIH): 145 mg/dL — ABNORMAL HIGH (ref 0–99)
Triglycerides: 185 mg/dL — ABNORMAL HIGH (ref 0–149)
VLDL Cholesterol Cal: 34 mg/dL (ref 5–40)

## 2023-11-09 LAB — TOXASSURE SELECT,+ANTIDEPR,UR

## 2023-11-09 NOTE — Progress Notes (Signed)
Internal Medicine Clinic Attending  Case discussed with the resident at the time of the visit.  We reviewed the resident's history and exam and pertinent patient test results.  I agree with the assessment, diagnosis, and plan of care documented in the resident's note.  

## 2023-12-16 ENCOUNTER — Other Ambulatory Visit: Payer: Self-pay | Admitting: Student

## 2023-12-16 NOTE — Progress Notes (Signed)
Pt is requesting that his medication be available to be picked up today instead of tomorrow because it is 30 days since his last dispense (checked via medication dispense history). I have called the pharmacy and they will release medication today. I will also schedule patient for a tele-health visit to help clear up the days he can actually get his dispenses of suboxone, as today is the 30th day after his last refill.

## 2024-01-12 ENCOUNTER — Telehealth: Payer: Self-pay | Admitting: Internal Medicine

## 2024-01-12 DIAGNOSIS — F1190 Opioid use, unspecified, uncomplicated: Secondary | ICD-10-CM

## 2024-01-12 MED ORDER — BUPRENORPHINE HCL-NALOXONE HCL 8-2 MG SL FILM: 1.0000 | ORAL_FILM | Freq: Two times a day (BID) | SUBLINGUAL | 0 refills | Status: DC

## 2024-01-12 NOTE — Telephone Encounter (Signed)
After-hours page from Mr. Albertina Senegal with questions about his suboxone prescription.  He expresses concern because the way his refills are sent, he is not able to pick them up until day 31 and would not be able to get his correct dosing because he can't go to the pharmacy until after work. I understand his concern, as it does make sense that he be able to pick up his prescription on day 30 for the next month.   I counted individual days starting on 12/21 which was his last fill day per PDMP and day 30 falls on January 19. This was a 30-day fill. Day 31 is 01/20. He will not have enough for his doses on either 01/20 or 01/21 based on this refill date. In order to have his supply for the morning of 12/20, he will need to be able to pick up the prescription on 12/19.   I have spoken with the pharmacy about early release of his refill and after much back and forth, I will just send in a new prescription and have the fill for 01/21 cancelled. Moving forward please make sure he will not have lapses in his refills.  Champ Mungo, DO

## 2024-02-07 ENCOUNTER — Other Ambulatory Visit: Payer: Self-pay

## 2024-02-07 DIAGNOSIS — F1190 Opioid use, unspecified, uncomplicated: Secondary | ICD-10-CM

## 2024-02-07 MED ORDER — BUPRENORPHINE HCL-NALOXONE HCL 8-2 MG SL FILM: 1.0000 | ORAL_FILM | Freq: Two times a day (BID) | SUBLINGUAL | 0 refills | Status: DC

## 2024-02-12 ENCOUNTER — Other Ambulatory Visit: Payer: Self-pay | Admitting: *Deleted

## 2024-02-12 DIAGNOSIS — F1190 Opioid use, unspecified, uncomplicated: Secondary | ICD-10-CM

## 2024-02-12 MED ORDER — BUPRENORPHINE HCL-NALOXONE HCL 8-2 MG SL FILM: 1.0000 | ORAL_FILM | Freq: Two times a day (BID) | SUBLINGUAL | 0 refills | Status: DC

## 2024-02-12 NOTE — Progress Notes (Deleted)
Message sent to doctorfor refill already.

## 2024-02-16 ENCOUNTER — Ambulatory Visit: Payer: Managed Care, Other (non HMO) | Admitting: Student

## 2024-02-16 VITALS — BP 142/78 | HR 95 | Temp 98.0°F | Ht 68.0 in | Wt 224.8 lb

## 2024-02-16 DIAGNOSIS — F1121 Opioid dependence, in remission: Secondary | ICD-10-CM

## 2024-02-16 DIAGNOSIS — J329 Chronic sinusitis, unspecified: Secondary | ICD-10-CM

## 2024-02-16 DIAGNOSIS — F1190 Opioid use, unspecified, uncomplicated: Secondary | ICD-10-CM

## 2024-02-16 MED ORDER — BUPRENORPHINE HCL-NALOXONE HCL 8-2 MG SL FILM: 1.0000 | ORAL_FILM | Freq: Two times a day (BID) | SUBLINGUAL | 0 refills | Status: DC

## 2024-02-16 NOTE — Assessment & Plan Note (Addendum)
Continues on stable dosing of Suboxone 8-2 mg twice daily with last fill on 02/12/2024 for 30-day supply.  Last tox assure is appropriate on 11/06/2023 which has been consistent.  PDMP reviewed today. - Continue Suboxone 8-2 mg twice daily, refill today for 03/11/2024 - Repeat tox assure at next visit in 3 months, telehealth appropriate if able to bring urine within 3-4 weeks of visit

## 2024-02-16 NOTE — Progress Notes (Signed)
   CC: Routine Follow Up for OUD after last office visit 11/06/2023  HPI:  Ray Gonzalez is a 48 y.o. male with pertinent PMH of OUD and class I obesity who presents as above. Please see assessment and plan below for further details.  Review of Systems:   Pertinent items noted in HPI and/or A&P.  Physical Exam:  Vitals:   02/16/24 0914  BP: (!) 142/78  Pulse: 95  Temp: 98 F (36.7 C)  TempSrc: Oral  SpO2: 99%  Weight: 224 lb 12.8 oz (102 kg)  Height: 5\' 8"  (1.727 m)    Constitutional:Well appearing adult male. In no acute distress. Pulm: Normal work of breathing on room air. Neuro:Alert and oriented x3. No focal deficit noted. Psych:Pleasant mood and affect.   Assessment & Plan:   Opioid use disorder, moderate, in sustained remission, on maintenance therapy (HCC) Continues on stable dosing of Suboxone 8-2 mg twice daily with last fill on 02/12/2024 for 30-day supply.  Last tox assure is appropriate on 11/06/2023 which has been consistent.  PDMP reviewed today. - Continue Suboxone 8-2 mg twice daily, refill today for 03/11/2024 - Repeat tox assure at next visit in 3 months, telehealth appropriate if able to bring urine within 3-4 weeks of visit    Patient discussed with Dr. Kris Mouton, DO [facility] Internal Medicine Resident PGY-2 Clinic Phone: [phone] Pager: [phone]

## 2024-02-19 NOTE — Progress Notes (Signed)
 Internal Medicine Clinic Attending  Case discussed with the resident at the time of the visit.  We reviewed the resident's history and exam and pertinent patient test results.  I agree with the assessment, diagnosis, and plan of care documented in the resident's note.

## 2024-03-11 ENCOUNTER — Telehealth: Payer: Self-pay

## 2024-03-11 NOTE — Telephone Encounter (Signed)
 Copied from CRM [id number]. Topic: Clinical - Prescription Issue >> Mar 11, 2024  3:11 PM Hamdi H wrote: Reason for CRM: Patient states that he would like someone from the clinic to call the pharmacy and tell them that tomorrow is day 30 for him he should be able to pick up his prescription for Buprenorphine HCl-Naloxone HCl (SUBOXONE) 8-2 MG FILM. He says he always gets push back from the pharmacy and would like help with this issue.  Can either of you please check to see if this is ok to be filled.   Renelda Loma RMA

## 2024-03-12 ENCOUNTER — Telehealth: Payer: Self-pay | Admitting: Nurse Practitioner

## 2024-03-12 NOTE — Telephone Encounter (Signed)
 This RN spoke to staff at [facility]. Script last filled 10/24, insurance reporting unable to be filled until April 7. Pt reports after speaking to this RN [facility] Pt will call and follow up if unable to fill.    Copied from CRM [id number]. Topic: Clinical - Prescription Issue >> Mar 11, 2024  3:11 PM Hamdi H wrote: Reason for CRM: Patient states that he would like someone from the clinic to call the pharmacy and tell them that tomorrow is day 30 for him he should be able to pick up his prescription for Buprenorphine HCl-Naloxone HCl (SUBOXONE) 8-2 MG FILM. He says he always gets push back from the pharmacy and would like help with this issue. >> Mar 12, 2024  5:49 PM Corin V wrote: Patient is asking to if nursing can call [facility] to confirm that his script was filled on 02/12/24 and that it would put him at his 30 days and have him ready for his refill.

## 2024-03-13 ENCOUNTER — Telehealth: Payer: Self-pay | Admitting: *Deleted

## 2024-03-13 NOTE — Telephone Encounter (Signed)
 Call to patient forgot that he had asked to have prescription sent to the [facility] in [place] due to it being available at the [facility] there.  Has since picked up the prescription and would like for the Buprenorphine prescription to continue to go to the [facility] in [place] [facility] [address].

## 2024-03-13 NOTE — Telephone Encounter (Signed)
 Copied from CRM [id number]. Topic: Clinical - Prescription Issue >> Mar 12, 2024  5:49 PM Corin V wrote: Reason for CRM: Patient is having an ongoing issue with getting his refills for his Buprenorphine HCl-Naloxone HCl (SUBOXONE) 8-2 MG FILM prescription. He stated that it has been going on for over a year. The refill indicates it was signed 02/12/24 and possibly again on 02/16/24. Wondering if prescription needs to be discontinued and entered new to avoid issues with pharmacy and insurance completing refills on time.

## 2024-04-08 ENCOUNTER — Telehealth: Payer: Self-pay | Admitting: *Deleted

## 2024-04-08 NOTE — Telephone Encounter (Unsigned)
 Copied from CRM [id number]. Topic: General - Other >> Apr 08, 2024  9:38 AM Adrianna P wrote: Reason for CRM: [facility] [address] and tell them its okay for patient to pick up medicine on the 16th

## 2024-04-10 ENCOUNTER — Other Ambulatory Visit: Payer: Self-pay | Admitting: Nurse Practitioner

## 2024-04-10 DIAGNOSIS — F1190 Opioid use, unspecified, uncomplicated: Secondary | ICD-10-CM

## 2024-04-10 NOTE — Telephone Encounter (Signed)
 Pt states he will be out of medication today. Per review of chart, pt called 04/14 regarding this medication. Routing to clinic for review.   Last Fill: 03/11/24  Last OV: 02/16/24 Next OV: 05/15/24  Routing to provider for review/authorization.   Copied from CRM [id number]. Topic: Clinical - Prescription Issue >> Apr 10, 2024 11:39 AM Wynona Hedger wrote: Reason for CRM: patients prescription was sent to wrong pharmacy please do not send to the one on [address]

## 2024-04-10 NOTE — Telephone Encounter (Signed)
 Pt was here at the office about Suboxone refill. Stated it's due for a refill today. Also stated he always has problems getting it refilled. While he was here, he received a text message from [facility] that his rx will be ready @ 1500 pm today; stated he don';t usually use this pharmacy. [facility] called [facility] [address] ([facility]'s preferred [facility] who stated [facility] [address] found an older rx and they are working on it. Pt also scheduled his next appt 05/08/24. Removed pharmacies from chart except pt's preferred one per his request.

## 2024-05-07 ENCOUNTER — Other Ambulatory Visit: Payer: Self-pay | Admitting: Student

## 2024-05-07 DIAGNOSIS — F1190 Opioid use, unspecified, uncomplicated: Secondary | ICD-10-CM

## 2024-05-07 NOTE — Telephone Encounter (Signed)
 Please advise [facility] e

## 2024-05-07 NOTE — Telephone Encounter (Unsigned)
 Copied from CRM [id number]. Topic: Clinical - Medication Refill >> May 07, 2024  9:05 AM Blair Bumpers wrote: Medication: Buprenorphine  HCl-Naloxone  HCl (SUBOXONE ) 8-2 MG FILM  Has the patient contacted their pharmacy? No (Agent: If no, request that the patient contact the pharmacy for the refill. If patient does not wish to contact the pharmacy document the reason why and proceed with request.) (Agent: If yes, when and what did the pharmacy advise?)  This is the patient's preferred pharmacy:  [id number] [place], [place] - [address] [place] [address] Phone: [phone] Fax: [phone]  Is this the correct pharmacy for this prescription? Yes If no, delete pharmacy and type the correct one.   Has the prescription been filled recently? Yes  Is the patient out of the medication? Yes  Has the patient been seen for an appointment in the last year OR does the patient have an upcoming appointment? Yes  Can we respond through MyChart? Yes  Agent: Please be advised that Rx refills may take up to 3 business days. We ask that you follow-up with your pharmacy.

## 2024-05-08 ENCOUNTER — Encounter: Admitting: Student

## 2024-05-08 MED ORDER — BUPRENORPHINE HCL-NALOXONE HCL 8-2 MG SL FILM: 1.0000 | ORAL_FILM | Freq: Two times a day (BID) | SUBLINGUAL | 0 refills | Status: DC

## 2024-05-08 NOTE — Telephone Encounter (Signed)
 PDMP reviewed. Prescription sent to patient's preferred pharmacy as confirmed by e2c2 team.

## 2024-05-08 NOTE — Telephone Encounter (Signed)
 Copied from CRM [id number]. Topic: Clinical - Prescription Issue >> May 08, 2024 10:10 AM Suzette B wrote: Patient called in to advise he is getting frustrated with the way the prescription refills are going with him. Patient stated every time he turns around he can't get his refills completed when needed the last time it was at the incorrect and he want to make sure that the correct pharmacy has his prescriptions. I advised the patient of the grace period in between calling and having the prescriptions fill which can take up to 3 business days. He stated he has his last dosage tomorrow afternoon. I advised the patient I do apologize for the inconvenience and being that refill rquest was submitted for him yesterday he can call the pharmacy tomorrow to check the status. The patient stated he is sick of the way the prescriptions are being submitted so if necessary he will pick up the paper prescriptions and take it to the pharmacy himself and half it filled

## 2024-05-14 ENCOUNTER — Ambulatory Visit (INDEPENDENT_AMBULATORY_CARE_PROVIDER_SITE_OTHER): Admitting: Student

## 2024-05-14 ENCOUNTER — Encounter: Payer: Self-pay | Admitting: Student

## 2024-05-14 VITALS — BP 122/82 | HR 87 | Wt 223.2 lb

## 2024-05-14 DIAGNOSIS — F1190 Opioid use, unspecified, uncomplicated: Secondary | ICD-10-CM

## 2024-05-14 DIAGNOSIS — F1121 Opioid dependence, in remission: Secondary | ICD-10-CM | POA: Diagnosis not present

## 2024-05-14 MED ORDER — BUPRENORPHINE HCL-NALOXONE HCL 8-2 MG SL FILM: 1.0000 | ORAL_FILM | Freq: Two times a day (BID) | SUBLINGUAL | 2 refills | Status: DC

## 2024-05-14 MED ORDER — BUPRENORPHINE HCL-NALOXONE HCL 8-2 MG SL FILM
1.0000 | ORAL_FILM | Freq: Two times a day (BID) | SUBLINGUAL | 2 refills | Status: DC
Start: 2024-06-14 — End: 2024-05-28

## 2024-05-14 NOTE — Progress Notes (Signed)
 Internal Medicine Clinic Attending  Case discussed with the resident at the time of the visit.  We reviewed the resident's history and exam and pertinent patient test results.  I agree with the assessment, diagnosis, and plan of care documented in the resident's note.

## 2024-05-14 NOTE — Assessment & Plan Note (Signed)
 Patient has a past medical history of OUD.  He has been on Suboxone  for over 10 years.  He denies any substance use, withdrawals, cravings, or relapses.  He states he has been having issues with getting his prescription and therefore would request a printed prescription.  Our office does not have printed prescription.  Instead I called the pharmacy to make sure they received my prescription and they said yes.  Tox assure today.  PDMP reviewed today.  Plan: - Suboxone  8-2 mg twice daily refill sent for 06/08/24 - Follow-up tox assure - Follow-up in 3 months

## 2024-05-14 NOTE — Patient Instructions (Addendum)
 Hello Mr. Ray Gonzalez,   Thank you for letting us  provide your care today. We have confirmed with your pharmacy on [address] that your medication will be ready for pickup in June.   If you have any questions or concerns, call our clinic at [phone] or after hours call [phone] and ask for the internal medicine resident on call.   Thank you!

## 2024-05-14 NOTE — Progress Notes (Signed)
   CC: OUD follow-up  HPI:  Ray Gonzalez is a 48 y.o. male with a past medical history of OUD who presents for OUD follow-up.  Please see assessment and plan for full HPI.  Medications: OUD: Suboxone  8-2 mg 1 film twice daily Allergic rhinitis: Flonase  1 spray daily Constipation: Senokot 1 tablet daily  Past Medical History:  Diagnosis Date   Arm pain    Back pain    Neck pain      Current Outpatient Medications:    [START ON 06/14/2024] Buprenorphine  HCl-Naloxone  HCl (SUBOXONE ) 8-2 MG FILM, Place 1 Film under the tongue 2 (two) times daily., Disp: 60 each, Rfl: 2   fluticasone  (FLONASE ) 50 MCG/ACT nasal spray, Place 1 spray into both nostrils daily., Disp: 48 mL, Rfl: 3   senna (SENOKOT) 8.6 MG TABS tablet, Take 1 tablet (8.6 mg total) by mouth daily., Disp: 30 tablet, Rfl: 2  Review of Systems:   Negative except for what is stated in HPI  Physical Exam:  Vitals:   05/14/24 0945  BP: 122/82  Pulse: 87  SpO2: 97%  Weight: 223 lb 3.2 oz (101.2 kg)   General: Patient is sitting comfortably in the room  Head: Normocephalic, atraumatic  Cardio: Regular rate and rhythm, no murmurs, rubs or gallops Pulmonary: Clear to ausculation bilaterally with no rales, rhonchi, and crackles    Assessment & Plan:   Opioid use disorder, moderate, in sustained remission, on maintenance therapy [facility]) Patient has a past medical history of OUD.  He has been on Suboxone  for over 10 years.  He denies any substance use, withdrawals, cravings, or relapses.  He states he has been having issues with getting his prescription and therefore would request a printed prescription.  Our office does not have printed prescription.  Instead I called the pharmacy to make sure they received my prescription and they said yes.  Tox assure today.  PDMP reviewed today.  Plan: - Suboxone  8-2 mg twice daily refill sent for 06/08/24 - Follow-up tox assure - Follow-up in 3 months  Patient discussed with  Dr. Teola Felling, DO PGY-2 Internal Medicine Resident

## 2024-05-15 ENCOUNTER — Encounter: Payer: Managed Care, Other (non HMO) | Admitting: Student

## 2024-05-17 LAB — TOXASSURE SELECT,+ANTIDEPR,UR

## 2024-05-21 ENCOUNTER — Ambulatory Visit: Payer: Self-pay | Admitting: Student

## 2024-05-27 ENCOUNTER — Telehealth: Payer: Self-pay | Admitting: *Deleted

## 2024-05-28 ENCOUNTER — Other Ambulatory Visit: Payer: Self-pay | Admitting: *Deleted

## 2024-05-28 ENCOUNTER — Telehealth: Payer: Self-pay | Admitting: *Deleted

## 2024-05-28 DIAGNOSIS — F1190 Opioid use, unspecified, uncomplicated: Secondary | ICD-10-CM

## 2024-05-28 NOTE — Telephone Encounter (Signed)
 Copied from CRM [id number]. Topic: Clinical - Prescription Issue >> May 27, 2024 12:30 PM Madelyne Schiff wrote: Pt Gangemi stated he will be out of town June 10th-16th. He ask would it be possible to pick his medication, before he leaves?    Buprenorphine  HCl-Naloxone  HCl (SUBOXONE ) 8-2 MG FILM.   Please advise.

## 2024-05-28 NOTE — Telephone Encounter (Signed)
 Pt was here at the office  - stated he will be going out of town to [facility] from the 10th to 16th. Stated he's due for his next suboxone  refill on the 12th (last filled 5/14 per pt). Pt wants to know if he can get it refilled on the 10th?  Pt's LOV was 5/20 with Dr Lydia Sams.

## 2024-05-29 MED ORDER — BUPRENORPHINE HCL-NALOXONE HCL 8-2 MG SL FILM: 1.0000 | ORAL_FILM | Freq: Two times a day (BID) | SUBLINGUAL | 2 refills | Status: DC

## 2024-05-29 NOTE — Telephone Encounter (Signed)
 [facility]; spoke to Dieu-ha to give verbal order for early refill on Suboxone  per Dr Lydia Sams , no sooner than 6/10. She stated last refill was 5/14 and unsure if pt's insurance will pay, and pt may have to pay out of pocket ($355.00); but she will attach early refill note to the rx. I called pt to let him know who stated he has never ask for early refill in 10 yrs. I told him, it will depend on the insurance. He stated he was told calling his ins for an override; I told him he can do this.

## 2024-05-29 NOTE — Telephone Encounter (Signed)
 This has been addressed.

## 2024-05-29 NOTE — Telephone Encounter (Signed)
 This has been addressed and early refill has been approved for the 10th.

## 2024-07-05 ENCOUNTER — Other Ambulatory Visit: Payer: Self-pay | Admitting: Student

## 2024-07-05 DIAGNOSIS — F1190 Opioid use, unspecified, uncomplicated: Secondary | ICD-10-CM

## 2024-07-05 NOTE — Telephone Encounter (Signed)
 Copied from CRM [id number]. Topic: Clinical - Medication Refill >> Jul 05, 2024 12:26 PM Adrianna P wrote: Medication: Buprenorphine  HCl-Naloxone  HCl (SUBOXONE ) 8-2 MG FILM  Has the patient contacted their pharmacy? No (Agent: If no, request that the patient contact the pharmacy for the refill. If patient does not wish to contact the pharmacy document the reason why and proceed with request.) (Agent: If yes, when and what did the pharmacy advise?)  This is the patient's preferred pharmacy:  [id number] [place], [place] - [address] [place] [address] Phone: [phone] Fax: [phone]  Is this the correct pharmacy for this prescription? Yes If no, delete pharmacy and type the correct one.   Has the prescription been filled recently? No  Is the patient out of the medication? No  Has the patient been seen for an appointment in the last year OR does the patient have an upcoming appointment? Yes  Can we respond through MyChart? Yes  Agent: Please be advised that Rx refills may take up to 3 business days. We ask that you follow-up with your pharmacy.

## 2024-07-08 MED ORDER — BUPRENORPHINE HCL-NALOXONE HCL 8-2 MG SL FILM: 1.0000 | ORAL_FILM | Freq: Two times a day (BID) | SUBLINGUAL | 0 refills | Status: DC

## 2024-07-08 NOTE — Telephone Encounter (Signed)
 Last rx written - 06/04/24. Last OV - 05/14/24. Next OV - 08/13/24. TOX - 05/14/24.

## 2024-08-01 ENCOUNTER — Other Ambulatory Visit: Payer: Self-pay | Admitting: Nurse Practitioner

## 2024-08-01 DIAGNOSIS — F1190 Opioid use, unspecified, uncomplicated: Secondary | ICD-10-CM

## 2024-08-01 NOTE — Telephone Encounter (Signed)
 Copied from CRM [id number]. Topic: Clinical - Medication Refill >> Aug 01, 2024 12:30 PM Merlynn A wrote: Medication: Buprenorphine  HCl-Naloxone  HCl (SUBOXONE ) 8-2 MG FILM  Has the patient contacted their pharmacy? No (Agent: If no, request that the patient contact the pharmacy for the refill. If patient does not wish to contact the pharmacy document the reason why and proceed with request.) (Agent: If yes, when and what did the pharmacy advise?)  This is the patient's preferred pharmacy:  [id number] [place], [place] - [address] [place] [address] Phone: [phone] Fax: [phone]  Is this the correct pharmacy for this prescription? Yes If no, delete pharmacy and type the correct one.   Has the prescription been filled recently? No  Is the patient out of the medication? No  Has the patient been seen for an appointment in the last year OR does the patient have an upcoming appointment? Yes  Can we respond through MyChart? No  Agent: Please be advised that Rx refills may take up to 3 business days. We ask that you follow-up with your pharmacy.

## 2024-08-01 NOTE — Telephone Encounter (Signed)
 07/08/24 60 tabs/0 RF (30 day)

## 2024-08-01 NOTE — Telephone Encounter (Signed)
 Please advise [facility]

## 2024-08-02 NOTE — Telephone Encounter (Signed)
 Please advise [facility]

## 2024-08-06 ENCOUNTER — Ambulatory Visit: Admitting: Student

## 2024-08-06 ENCOUNTER — Telehealth: Payer: Self-pay

## 2024-08-06 VITALS — BP 123/82 | HR 96 | Temp 97.8°F | Ht 68.0 in | Wt 222.6 lb

## 2024-08-06 DIAGNOSIS — F1121 Opioid dependence, in remission: Secondary | ICD-10-CM | POA: Diagnosis not present

## 2024-08-06 DIAGNOSIS — Z0000 Encounter for general adult medical examination without abnormal findings: Secondary | ICD-10-CM

## 2024-08-06 DIAGNOSIS — Z6833 Body mass index (BMI) 33.0-33.9, adult: Secondary | ICD-10-CM | POA: Diagnosis not present

## 2024-08-06 DIAGNOSIS — E669 Obesity, unspecified: Secondary | ICD-10-CM

## 2024-08-06 DIAGNOSIS — F1190 Opioid use, unspecified, uncomplicated: Secondary | ICD-10-CM

## 2024-08-06 DIAGNOSIS — Z1211 Encounter for screening for malignant neoplasm of colon: Secondary | ICD-10-CM

## 2024-08-06 MED ORDER — BUPRENORPHINE HCL-NALOXONE HCL 8-2 MG SL FILM
1.0000 | ORAL_FILM | Freq: Two times a day (BID) | SUBLINGUAL | 0 refills | Status: DC
Start: 2024-08-07 — End: 2024-10-01
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
## 2024-08-06 NOTE — Assessment & Plan Note (Signed)
 Screening colonoscopy referral sent.

## 2024-08-06 NOTE — Patient Instructions (Signed)
 Count you calories. Aim for less than 2,000 a day Limit fast food to less than 1 time a week

## 2024-08-06 NOTE — Assessment & Plan Note (Signed)
 In remission for 10+ years and he has been on Suboxone  during this time.  Denies substance use aside from occasional alcohol.  Denies cravings/symptoms of withdrawal.  PDMP reviewed.  Refilled Suboxone  8-2 mg twice daily for 30-day supply.  04/2024 tox assure unremarkable aside from metabolites of alcohol.  I counseled on limiting alcohol intake.  Follow-up in 3 months.

## 2024-08-06 NOTE — Telephone Encounter (Signed)
 Copied from CRM [id number]. Topic: Clinical - Prescription Issue >> Aug 06, 2024  8:36 AM Merlynn A wrote: Reason for CRM: Patient called in regarding medication Buprenorphine  HCl-Naloxone  HCl (SUBOXONE ) 8-2 MG FILM. Please contact patient to advise update on prescription refill for medication. Patient has been waiting for more than 3 days for refill and have no update. Patient can be reached at [phone].

## 2024-08-06 NOTE — Assessment & Plan Note (Addendum)
 He inquires about weight loss options today.  He has had a nondiabetic/prediabetic A1c in the past.  He has worked on his diet and has limited soda intake over the past few months.  He is active with work but is not a member at a gym.  He does eat fast food a few times per week.  I counseled him today on how to count calories and limit caloric intake to less than 2000.  He will also look into joining a gym.  Further counseled on how alcohol can lead to weight gain.  He will work hard over the next few months and see if he can combat weight loss without potential GLP-1 use as this would likely be expensive for him.

## 2024-08-07 NOTE — Addendum Note (Signed)
 Addended by: KARNA FELLOWS on: 08/07/2024 02:47 PM   Modules accepted: Level of Service

## 2024-08-07 NOTE — Progress Notes (Signed)
 Internal Medicine Clinic Attending  Case discussed with the resident at the time of the visit.  We reviewed the resident's history and exam and pertinent patient test results.  I agree with the assessment, diagnosis, and plan of care documented in the resident's note.

## 2024-08-13 ENCOUNTER — Ambulatory Visit: Payer: Self-pay | Admitting: Student

## 2024-08-28 ENCOUNTER — Other Ambulatory Visit (HOSPITAL_COMMUNITY): Payer: Self-pay

## 2024-08-28 ENCOUNTER — Telehealth: Payer: Self-pay | Admitting: *Deleted

## 2024-08-28 NOTE — Telephone Encounter (Signed)
 Will froward to [facility] [place] to see if there are any programs.  Copied from CRM [id number]. Topic: Clinical - Medical Advice >> Aug 28, 2024  9:09 AM Ray Gonzalez ORN wrote: Reason for CRM: pt. Will be having issue with getting  medication refills Buprenorphine  HCl-Naloxone  HCl (SUBOXONE ) 8-2 MG FILM for the next 3 months  pt. has new job and his insurance not effective until december 2025   Pt. have question about getting help locating regarding getting a discount on medication patient stated he used it before , its a paper that advertise the name of SUBOXONE ) where you can pay $5 for medication  for a month supply

## 2024-08-30 NOTE — Telephone Encounter (Signed)
 Pt has called and scheduled his appointment with a different provider.  Name: Ray Gonzalez, Ray Gonzalez MRN: [id number]  Date: 12/03/2024 Status: Sch  Time: 3:45 PM Length: 30  Visit Type: OPEN ESTABLISHED [726] Copay: $0.00  Provider: Marylu Gee, DO      Copied from CRM [id number]. Topic: Appointments - Scheduling Inquiry for Clinic >> Aug 28, 2024  9:07 AM [name] wrote: Reason for CRM: patient requesting to reschedule his 3 month followup appointment with Michael Zheng for around December 06 2024 reason he has a new job and his insurance will not be effective until december   Please contact patient to reschedule ,  Not appointment comes up for patient access specialist to reschedule pt   ----------------------------------------------------------------------- From previous Reason for Contact - Cancel/Reschedule: Patient/patient representative is calling to cancel or reschedule an appointment. Refer to attachments for appointment information.

## 2024-09-04 ENCOUNTER — Ambulatory Visit: Payer: Self-pay

## 2024-09-04 NOTE — Telephone Encounter (Signed)
 FYI Only or Action Required?: Action required by provider: medication refill request.  Patient was last seen in primary care on 08/06/2024 by Marylu Gee, DO.  Called Nurse Triage reporting Advice Only.  Symptoms began today.  Interventions attempted: Other: pt stated call several times to request suboxone .   Triage Disposition: Information or Advice Only Call  Patient/caregiver understands and will follow disposition?: Yes     Summary: [phone] (Mobile)   Reason for Triage: Buprenorphine  HCl-Naloxone  HCl (SUBOXONE ) 8-2 MG FILM patient is calling in regards to this medication not being filled at this time, after further review I advised the patient that it shows he was notified that he didn't have a PCP to fill the medication he continued to go back and forth stating he didn't like the clinic due to the fact that everytime he went it was a different provider. I advised the patient that due to it being a residency clinic that would happen sometimes. He advised he's on his last strip today and has been calling since the 2nd of September with no help. Please call patient and advise     Reason for Disposition  Health information question, no triage required and triager able to answer question    Routing encounter to provider for refill update  Answer Assessment - Initial Assessment Questions 1. REASON FOR CALL: What is the main reason for your call? or How can I best help you?   Pt stated today is the last day he has medication:  pt stated every month he has to call several times to attempt to get his refill on this medication. He called last Tuesday per pt to request the refill, pt also stated last month while in the office with provider -pt talked with the provider and discussed this issue: pt was informed it would not happen again but it has happened.  Pt states he gets off work at [employer] and he would like to go straight to pharmacy to pick it up because after work he does not want  to have to come back out to get it.  Pt very upset that he refill has not been done.please follow up with patient on refill quest: pt also stated he is not suppose to miss any days of medication or he might start having withdrawals.  Protocols used: Information Only Call - No Triage-A-AH

## 2024-10-01 ENCOUNTER — Other Ambulatory Visit: Payer: Self-pay | Admitting: Student

## 2024-10-01 DIAGNOSIS — F1190 Opioid use, unspecified, uncomplicated: Secondary | ICD-10-CM

## 2024-10-01 NOTE — Telephone Encounter (Unsigned)
 Copied from CRM [id number]. Topic: Clinical - Medication Refill >> Oct 01, 2024 12:28 PM [name] wrote: Medication: Buprenorphine  HCl-Naloxone  HCl (SUBOXONE ) 8-2 MG FILM  Has the patient contacted their pharmacy? No (Agent: If no, request that the patient contact the pharmacy for the refill. If patient does not wish to contact the pharmacy document the reason why and proceed with request.) (Agent: If yes, when and what did the pharmacy advise?)  This is the patient's preferred pharmacy:  [id number] [place], [place] - [address] [place] [address] Phone: [phone] Fax: [phone]  Is this the correct pharmacy for this prescription? Yes If no, delete pharmacy and type the correct one.   Has the prescription been filled recently? No  Is the patient out of the medication? No  Has the patient been seen for an appointment in the last year OR does the patient have an upcoming appointment? Yes  Can we respond through MyChart? No  Agent: Please be advised that Rx refills may take up to 3 business days. We ask that you follow-up with your pharmacy.

## 2024-10-02 NOTE — Telephone Encounter (Signed)
 Last rx written - 08/07/24. Last OV - 08/06/24. Next OV - 12/03/24. TOX - 05/14/24.

## 2024-10-03 MED ORDER — BUPRENORPHINE HCL-NALOXONE HCL 8-2 MG SL FILM: 1.0000 | ORAL_FILM | Freq: Two times a day (BID) | SUBLINGUAL | 0 refills | Status: DC

## 2024-10-30 ENCOUNTER — Other Ambulatory Visit: Payer: Self-pay | Admitting: Student

## 2024-10-30 DIAGNOSIS — F1190 Opioid use, unspecified, uncomplicated: Secondary | ICD-10-CM

## 2024-10-30 MED ORDER — BUPRENORPHINE HCL-NALOXONE HCL 8-2 MG SL FILM: 1.0000 | ORAL_FILM | Freq: Two times a day (BID) | SUBLINGUAL | 0 refills | Status: DC

## 2024-10-30 NOTE — Telephone Encounter (Signed)
 Copied from CRM [id number]. Topic: Clinical - Medication Refill >> Oct 30, 2024 12:03 PM Carrielelia G wrote: Medication: Buprenorphine  HCl-Naloxone  HCl (SUBOXONE ) 8-2 MG FILM  Has the patient contacted their pharmacy? Yes  (Agent: If no, request that the patient contact the pharmacy for the refill. If patient does not wish to contact the pharmacy document the reason why and proceed with request.) (Agent: If yes, when and what did the pharmacy advise?)  This is the patient's preferred pharmacy:  [id number] [place], [place] - [address] [place] [address] Phone: [phone] Fax: [phone]  Is this the correct pharmacy for this prescription? Yes If no, delete pharmacy and type the correct one.   Is the patient out of the medication? No  Has the patient been seen for an appointment in the last year OR does the patient have an upcoming appointment? Yes  Can we respond through MyChart? No  Agent: Please be advised that Rx refills may take up to 3 business days. We ask that you follow-up with your pharmacy.

## 2024-10-30 NOTE — Telephone Encounter (Signed)
 Copied from CRM [id number]. Topic: Clinical - Medication Question >> Oct 30, 2024 12:07 PM Carrielelia G wrote: Reason for CRM:  Buprenorphine  HCl-Naloxone  HCl (SUBOXONE ) 8-2 MG FILM  Question will I still be able to get my meds since I delayed my appointment.  (Which was due to my insurance not kicking in until December 9th)  Please advise

## 2024-10-30 NOTE — Telephone Encounter (Signed)
 Pt's appt is 12/9 with Dr Marylu.

## 2024-11-06 ENCOUNTER — Encounter: Admitting: Student

## 2024-11-26 ENCOUNTER — Other Ambulatory Visit: Payer: Self-pay | Admitting: Student

## 2024-11-26 DIAGNOSIS — F1190 Opioid use, unspecified, uncomplicated: Secondary | ICD-10-CM

## 2024-11-26 NOTE — Telephone Encounter (Unsigned)
 Copied from CRM [id number]. Topic: Clinical - Medication Refill >> Nov 26, 2024  9:47 AM Ray Gonzalez wrote: Medication: Buprenorphine  HCl-Naloxone  HCl (SUBOXONE ) 8-2 MG FILM The patient has a appointment scheduled 12/03/2024  The patient states new insurance active now  Has the patient contacted their pharmacy? Yes there was no refills  (Agent: If no, request that the patient contact the pharmacy for the refill. If patient does not wish to contact the pharmacy document the reason why and proceed with request.) (Agent: If yes, when and what did the pharmacy advise?)  This is the patient's preferred pharmacy:  [id number] [place], [place] - [address] [place] [address] Phone: [phone] Fax: [phone]  Is this the correct pharmacy for this prescription? Yes  If no, delete pharmacy and type the correct one.   Has the prescription been filled recently? Yes   Is the patient out of the medication? No   Has the patient been seen for an appointment in the last year OR does the patient have an upcoming appointment? Yes   Can we respond through MyChart? No   Agent: Please be advised that Rx refills may take up to 3 business days. We ask that you follow-up with your pharmacy.

## 2024-11-27 MED ORDER — BUPRENORPHINE HCL-NALOXONE HCL 8-2 MG SL FILM: 1.0000 | ORAL_FILM | Freq: Two times a day (BID) | SUBLINGUAL | 0 refills | Status: DC

## 2024-12-03 ENCOUNTER — Ambulatory Visit: Payer: Self-pay | Admitting: Student

## 2024-12-03 VITALS — BP 120/78 | HR 69 | Temp 98.3°F | Ht 68.0 in | Wt 219.6 lb

## 2024-12-03 DIAGNOSIS — E785 Hyperlipidemia, unspecified: Secondary | ICD-10-CM

## 2024-12-03 DIAGNOSIS — Z0000 Encounter for general adult medical examination without abnormal findings: Secondary | ICD-10-CM

## 2024-12-03 DIAGNOSIS — F1190 Opioid use, unspecified, uncomplicated: Secondary | ICD-10-CM

## 2024-12-03 DIAGNOSIS — E669 Obesity, unspecified: Secondary | ICD-10-CM

## 2024-12-03 MED ORDER — BUPRENORPHINE HCL-NALOXONE HCL 8-2 MG SL FILM: 1.0000 | ORAL_FILM | Freq: Two times a day (BID) | SUBLINGUAL | 5 refills | Status: AC

## 2024-12-03 NOTE — Assessment & Plan Note (Signed)
 GI referral for colonoscopy.  Orders:   Ambulatory referral to Gastroenterology

## 2024-12-03 NOTE — Assessment & Plan Note (Signed)
 Patient motivated to lose weight. Patient endorsed usually eating more at night due to not eating much throughout the day. Counseled patient on eating multiple smaller meals a day, and tracking calories with the goal of being in a caloric deficit. Patient also endorses being mostly sedentary. Also counseled patient on increasing activity level, with simple things that he could do such as going on a 10-15 minute walk everyday to start. Patient is agreeable to making lifestyle changes, please follow up.

## 2024-12-03 NOTE — Progress Notes (Unsigned)
 This is a Psychologist, Occupational Note.  The care of the patient was discussed with Dr. Marylu and the assessment and plan was formulated with their assistance.  Please see their note for official documentation of the patient encounter.   Subjective:   Patient ID: Ray Gonzalez male   DOB: December 14, 1976 47 y.o.   MRN: [id number]  HPI: [name] is a 48 y.o. with past medical history of opoid use disorder who presents to clinic for follow up. Patient has no acute concerns for today.    Past Medical History:  Diagnosis Date   Arm pain    Back pain    Neck pain    Current Outpatient Medications  Medication Sig Dispense Refill   [START ON 12/29/2024] Buprenorphine  HCl-Naloxone  HCl (SUBOXONE ) 8-2 MG FILM Place 1 Film under the tongue 2 (two) times daily. 60 each 5   No current facility-administered medications for this visit.   No family history on file. Social History   Socioeconomic History   Marital status: Married    Spouse name: Not on file   Number of children: Not on file   Years of education: Not on file   Highest education level: Not on file  Occupational History   Not on file  Tobacco Use   Smoking status: Former    Current packs/day: 0.00    Types: Cigarettes    Quit date: 2021    Years since quitting: 4.9   Smokeless tobacco: Never  Substance and Sexual Activity   Alcohol use: No   Drug use: No   Sexual activity: Not on file  Other Topics Concern   Not on file  Social History Narrative   Not on file   Social Drivers of Health   Financial Resource Strain: Low Risk  (08/06/2024)   Overall Financial Resource Strain (CARDIA)    Difficulty of Paying Living Expenses: Not hard at all  Food Insecurity: No Food Insecurity (08/06/2024)   Hunger Vital Sign    Worried About Running Out of Food in the Last Year: Never true    Ran Out of Food in the Last Year: Never true  Transportation Needs: No Transportation Needs (08/06/2024)   PRAPARE - Therapist, Art (Medical): No    Lack of Transportation (Non-Medical): No  Physical Activity: Inactive (08/06/2024)   Exercise Vital Sign    Days of Exercise per Week: 6 days    Minutes of Exercise per Session: 0 min  Stress: No Stress Concern Present (08/06/2024)   [employer] of Occupational Health - Occupational Stress Questionnaire    Feeling of Stress: Only a little  Social Connections: Unknown (08/06/2024)   Social Connection and Isolation Panel    Frequency of Communication with Friends and Family: Patient declined    Frequency of Social Gatherings with Friends and Family: Never    Attends Religious Services: Never    Database Administrator or Organizations: No    Attends Engineer, Structural: Never    Marital Status: Separated   Review of Systems: Review of systems not obtained due to patient factors. Objective:  Physical Exam: Vitals:   12/03/24 1533  BP: 120/78  Pulse: 69  Temp: 98.3 F (36.8 C)  TempSrc: Oral  SpO2: 97%  Weight: 219 lb 9.6 oz (99.6 kg)  Height: 5' 8 (1.727 m)   BP 120/78 (BP Location: Left Arm, Patient Position: Sitting, Cuff Size: Small)   Pulse 69   Temp 98.3  F (36.8 C) (Oral)   Ht 5' 8 (1.727 m)   Wt 219 lb 9.6 oz (99.6 kg)   SpO2 97%   BMI 33.39 kg/m   General Appearance:    Alert, cooperative, no distress, appears stated age  Head:    Normocephalic, without obvious abnormality, atraumatic  Lungs:     Clear to auscultation bilaterally, respirations unlabored  Heart:    Regular rate and rhythm, S1 and S2 normal, no murmur, rub   or gallop   Assessment & Plan:   Assessment & Plan Opioid use disorder In remission for 10+ years and he has been on Suboxone  during this time. Denies cravings/symptoms of withdrawal.  PDMP reviewed.  Refilled Suboxone  8-2 mg twice daily for 30-day supply. ToxAssure pending, will follow up. Follow-up in 6 months.   Orders:   ToxAssure Select,+Antidepr,UR   Buprenorphine   HCl-Naloxone  HCl (SUBOXONE ) 8-2 MG FILM; Place 1 Film under the tongue 2 (two) times daily.  Hyperlipidemia, unspecified hyperlipidemia type Lipid panel in 10/2023 resulted in cholesterol 216 and LDL 145. ASCVD 3%. Repeat lipid panel today, will follow up. If still elevated, will consider starting statin.  Orders:   Lipid Profile  Healthcare maintenance GI referral for colonoscopy.  Orders:   Ambulatory referral to Gastroenterology  Obesity (BMI 30-39.9) Patient motivated to lose weight. Patient endorsed usually eating more at night due to not eating much throughout the day. Counseled patient on eating multiple smaller meals a day, and tracking calories with the goal of being in a caloric deficit. Patient also endorses being mostly sedentary. Also counseled patient on increasing activity level, with simple things that he could do such as going on a 10-15 minute walk everyday to start. Patient is agreeable to making lifestyle changes, please follow up.      Discussed and evaluated with Dr. Marylu  and discussed plan with Dr. Lovie.    Cozetta Pereyra, MS3

## 2024-12-04 ENCOUNTER — Ambulatory Visit: Payer: Self-pay | Admitting: Student

## 2024-12-04 ENCOUNTER — Other Ambulatory Visit: Payer: Self-pay | Admitting: Student

## 2024-12-04 DIAGNOSIS — E785 Hyperlipidemia, unspecified: Secondary | ICD-10-CM

## 2024-12-04 LAB — LIPID PANEL
Chol/HDL Ratio: 6.9 ratio — ABNORMAL HIGH (ref 0.0–5.0)
Cholesterol, Total: 215 mg/dL — ABNORMAL HIGH (ref 100–199)
HDL: 31 mg/dL — ABNORMAL LOW (ref >39)
LDL Chol Calc (NIH): 139 mg/dL — ABNORMAL HIGH (ref 0–99)
Triglycerides: 247 mg/dL — ABNORMAL HIGH (ref 0–149)
VLDL Cholesterol Cal: 45 mg/dL — ABNORMAL HIGH (ref 5–40)

## 2024-12-04 MED ORDER — ROSUVASTATIN CALCIUM 5 MG PO TABS: 5.0000 mg | ORAL_TABLET | Freq: Every day | ORAL | 3 refills | Status: AC

## 2024-12-07 LAB — TOXASSURE SELECT,+ANTIDEPR,UR

## 2024-12-09 NOTE — Progress Notes (Signed)
 Internal Medicine Clinic Attending  Case discussed with the resident at the time of the visit.  We reviewed the resident's history and exam and pertinent patient test results.  I agree with the assessment, diagnosis, and plan of care documented in the resident's note.
